# Patient Record
Sex: Female | Born: 1990 | Race: Black or African American | Hispanic: No | Marital: Single | Smoking: Never smoker
Health system: Southern US, Community
[De-identification: ages and names within clinical notes are randomized; demographics above are authoritative.]

## PROBLEM LIST (undated history)

## (undated) ENCOUNTER — Inpatient Hospital Stay (HOSPITAL_COMMUNITY): Payer: Self-pay

## (undated) DIAGNOSIS — O4590 Premature separation of placenta, unspecified, unspecified trimester: Secondary | ICD-10-CM

## (undated) DIAGNOSIS — R569 Unspecified convulsions: Secondary | ICD-10-CM

## (undated) DIAGNOSIS — Z8759 Personal history of other complications of pregnancy, childbirth and the puerperium: Secondary | ICD-10-CM

## (undated) DIAGNOSIS — R87629 Unspecified abnormal cytological findings in specimens from vagina: Secondary | ICD-10-CM

## (undated) HISTORY — DX: Personal history of other complications of pregnancy, childbirth and the puerperium: Z87.59

## (undated) HISTORY — DX: Unspecified convulsions: R56.9

## (undated) HISTORY — PX: NO PAST SURGERIES: SHX2092

## (undated) HISTORY — DX: Unspecified abnormal cytological findings in specimens from vagina: R87.629

---

## 2014-09-14 ENCOUNTER — Encounter: Payer: Self-pay | Admitting: Obstetrics & Gynecology

## 2014-09-14 ENCOUNTER — Other Ambulatory Visit (HOSPITAL_COMMUNITY)
Admission: RE | Admit: 2014-09-14 | Discharge: 2014-09-14 | Disposition: A | Discharge status: Home / Self Care | Payer: BLUE CROSS/BLUE SHIELD | Source: Ambulatory Visit | Attending: Obstetrics & Gynecology | Admitting: Obstetrics & Gynecology

## 2014-09-14 ENCOUNTER — Ambulatory Visit (INDEPENDENT_AMBULATORY_CARE_PROVIDER_SITE_OTHER): Payer: BLUE CROSS/BLUE SHIELD | Admitting: Obstetrics & Gynecology

## 2014-09-14 VITALS — BP 115/73 | HR 84 | Resp 20 | Ht 63.0 in | Wt 154.1 lb

## 2014-09-14 DIAGNOSIS — R87612 Low grade squamous intraepithelial lesion on cytologic smear of cervix (LGSIL): Secondary | ICD-10-CM | POA: Diagnosis not present

## 2014-09-14 DIAGNOSIS — R87619 Unspecified abnormal cytological findings in specimens from cervix uteri: Secondary | ICD-10-CM | POA: Diagnosis present

## 2014-09-14 DIAGNOSIS — Z3202 Encounter for pregnancy test, result negative: Secondary | ICD-10-CM | POA: Diagnosis not present

## 2014-09-14 LAB — POCT PREGNANCY, URINE: Preg Test, Ur: NEGATIVE

## 2014-09-14 NOTE — Progress Notes (Signed)
Patient ID: Marissa Cross, female   DOB: Feb 26, 1990, 24 y.o.   MRN: [id number]  Chief Complaint  Patient presents with  . Referral    [facility] - abnormal Pap    HPI Marissa Cross is a 24 y.o. female.  G1P1001 Patient's last menstrual period was 06/15/2014 (approximate). LSIL pap at [facility]  HPI  Indications: Pap smear on June 2016 showed: low-grade squamous intraepithelial neoplasia (LGSIL - encompassing HPV,mild dysplasia,CIN I). Previous colposcopy: no. Prior cervical treatment:  . Previous ASCUS Past Medical History  Diagnosis Date  . Vaginal Pap smear, abnormal     History reviewed. No pertinent past surgical history.  History reviewed. No pertinent family history.  Social History Social History  Substance Use Topics  . Smoking status: Never Smoker   . Smokeless tobacco: None  . Alcohol Use: Yes     Comment: holidays only    No Known Allergies  Current Outpatient Prescriptions  Medication Sig Dispense Refill  . Levonorgestrel-Ethinyl Estradiol (ASHLYNA) 0.15-0.03 &0.01 MG tablet Take 1 tablet by mouth daily.     No current facility-administered medications for this visit.    Review of Systems Review of Systems  Constitutional: Negative.   Genitourinary: Negative for vaginal bleeding, menstrual problem and pelvic pain.    Blood pressure 115/73, pulse 84, resp. rate 20, height  (1.6 m), weight 154 lb 1.6 oz (69.899 kg), last menstrual period 06/15/2014.  Physical Exam Physical Exam  Constitutional: She appears well-developed. No distress.  Pulmonary/Chest: Effort normal.  Genitourinary: Vagina normal. No vaginal discharge found.  Psychiatric: She has a normal mood and affect.    Data Reviewed Pap result  Assessment    Procedure Details  The risks and benefits of the procedure and Written informed consent obtained.  Speculum placed in vagina and excellent visualization of cervix achieved, cervix swabbed x 3 with acetic acid solution. AWE ant and post  cervix c/w LSIL, no endocervical lesion Specimens: ECC and 12 and 500  Complications: none. Monsel's soln applied and good hemostasis    Plan    Specimens labelled and sent to Pathology. Return to discuss Pathology results in 2 weeks.      Marissa Cross 09/14/2014, 3:00 PM

## 2014-09-14 NOTE — Patient Instructions (Signed)

## 2014-09-16 ENCOUNTER — Encounter: Payer: Self-pay | Admitting: Obstetrics & Gynecology

## 2014-09-16 DIAGNOSIS — N870 Mild cervical dysplasia: Secondary | ICD-10-CM | POA: Insufficient documentation

## 2014-09-16 HISTORY — DX: Mild cervical dysplasia: N87.0

## 2014-09-20 ENCOUNTER — Telehealth: Payer: Self-pay | Admitting: *Deleted

## 2014-09-20 NOTE — Telephone Encounter (Signed)
Pt needs to be informed she  will need cotesting in 12 months, result note on surgical pathology report.  Attempted to contact patient, no answer, left message for patient to return call to the clinic.

## 2014-09-21 ENCOUNTER — Encounter: Payer: Self-pay | Admitting: *Deleted

## 2014-09-21 NOTE — Telephone Encounter (Signed)
Attempted to contact patient, no answer, will send letter. Certified letter sent.

## 2014-09-30 ENCOUNTER — Ambulatory Visit: Payer: BLUE CROSS/BLUE SHIELD | Admitting: Family Medicine

## 2014-11-21 ENCOUNTER — Ambulatory Visit (INDEPENDENT_AMBULATORY_CARE_PROVIDER_SITE_OTHER): Payer: BLUE CROSS/BLUE SHIELD | Admitting: Obstetrics & Gynecology

## 2014-11-21 ENCOUNTER — Encounter: Payer: Self-pay | Admitting: Obstetrics & Gynecology

## 2014-11-21 VITALS — BP 101/68 | HR 68 | Temp 98.7°F | Wt 157.4 lb

## 2014-11-21 DIAGNOSIS — N9489 Other specified conditions associated with female genital organs and menstrual cycle: Secondary | ICD-10-CM | POA: Diagnosis not present

## 2014-11-21 DIAGNOSIS — R87612 Low grade squamous intraepithelial lesion on cytologic smear of cervix (LGSIL): Secondary | ICD-10-CM | POA: Diagnosis not present

## 2014-11-21 DIAGNOSIS — N898 Other specified noninflammatory disorders of vagina: Secondary | ICD-10-CM

## 2014-11-21 DIAGNOSIS — N870 Mild cervical dysplasia: Secondary | ICD-10-CM | POA: Insufficient documentation

## 2014-11-21 NOTE — Progress Notes (Signed)
   Subjective:    Patient ID: Marissa Cross, female    DOB: Jun 01, 1990, 24 y.o.   MRN: [id number]  HPI  24 yo AA 5P1 (854 yo son) is here today with the complaint of a unpleasant vaginal odor since her colpo and also to discuss her colpo results.   Review of Systems She gets regular STI testing at the health dept.    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

## 2014-11-22 LAB — WET PREP, GENITAL: TRICH WET PREP: NONE SEEN

## 2014-11-25 ENCOUNTER — Emergency Department (HOSPITAL_COMMUNITY)
Admission: EM | Admit: 2014-11-25 | Discharge: 2014-11-25 | Disposition: A | Discharge status: Home / Self Care | Payer: BLUE CROSS/BLUE SHIELD | Attending: Emergency Medicine | Admitting: Emergency Medicine

## 2014-11-25 ENCOUNTER — Encounter (HOSPITAL_COMMUNITY): Payer: Self-pay

## 2014-11-25 DIAGNOSIS — Y998 Other external cause status: Secondary | ICD-10-CM | POA: Diagnosis not present

## 2014-11-25 DIAGNOSIS — S3992XA Unspecified injury of lower back, initial encounter: Secondary | ICD-10-CM | POA: Diagnosis present

## 2014-11-25 DIAGNOSIS — Y9289 Other specified places as the place of occurrence of the external cause: Secondary | ICD-10-CM | POA: Diagnosis not present

## 2014-11-25 DIAGNOSIS — Y9389 Activity, other specified: Secondary | ICD-10-CM | POA: Diagnosis not present

## 2014-11-25 MED ORDER — METHOCARBAMOL 500 MG PO TABS: 500.0000 mg | ORAL_TABLET | Freq: Two times a day (BID) | ORAL | Status: DC

## 2014-11-25 MED ORDER — IBUPROFEN 800 MG PO TABS: 800.0000 mg | ORAL_TABLET | Freq: Three times a day (TID) | ORAL | Status: DC
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
## 2014-11-25 NOTE — Discharge Instructions (Signed)
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
## 2014-11-28 ENCOUNTER — Telehealth (HOSPITAL_COMMUNITY): Payer: Self-pay

## 2014-11-28 ENCOUNTER — Telehealth (HOSPITAL_BASED_OUTPATIENT_CLINIC_OR_DEPARTMENT_OTHER): Payer: Self-pay

## 2015-03-02 ENCOUNTER — Inpatient Hospital Stay (HOSPITAL_COMMUNITY)
Admission: AD | Admit: 2015-03-02 | Discharge: 2015-03-02 | Disposition: A | Discharge status: Home / Self Care | Payer: Managed Care, Other (non HMO) | Source: Ambulatory Visit | Attending: Obstetrics & Gynecology | Admitting: Obstetrics & Gynecology

## 2015-03-02 ENCOUNTER — Encounter (HOSPITAL_COMMUNITY): Payer: Self-pay | Admitting: *Deleted

## 2015-03-02 DIAGNOSIS — Z3202 Encounter for pregnancy test, result negative: Secondary | ICD-10-CM | POA: Insufficient documentation

## 2015-03-02 DIAGNOSIS — N898 Other specified noninflammatory disorders of vagina: Secondary | ICD-10-CM | POA: Diagnosis not present

## 2015-03-02 DIAGNOSIS — R109 Unspecified abdominal pain: Secondary | ICD-10-CM | POA: Insufficient documentation

## 2015-03-02 DIAGNOSIS — N926 Irregular menstruation, unspecified: Secondary | ICD-10-CM | POA: Diagnosis not present

## 2015-03-02 DIAGNOSIS — N921 Excessive and frequent menstruation with irregular cycle: Secondary | ICD-10-CM | POA: Diagnosis not present

## 2015-03-02 LAB — URINE MICROSCOPIC-ADD ON

## 2015-03-02 LAB — CBC
HEMATOCRIT: 38.8 % (ref 36.0–46.0)
HEMOGLOBIN: 14.1 g/dL (ref 12.0–15.0)
MCH: 31.1 pg (ref 26.0–34.0)
MCHC: 36.3 g/dL — ABNORMAL HIGH (ref 30.0–36.0)
MCV: 85.5 fL (ref 78.0–100.0)
Platelets: 295 10*3/uL (ref 150–400)
RBC: 4.54 MIL/uL (ref 3.87–5.11)
RDW: 12.5 % (ref 11.5–15.5)
WBC: 4.2 10*3/uL (ref 4.0–10.5)

## 2015-03-02 LAB — URINALYSIS, ROUTINE W REFLEX MICROSCOPIC
Bilirubin Urine: NEGATIVE
Glucose, UA: NEGATIVE mg/dL
Ketones, ur: NEGATIVE mg/dL
LEUKOCYTES UA: NEGATIVE
Nitrite: NEGATIVE
Protein, ur: NEGATIVE mg/dL
Specific Gravity, Urine: 1.025 (ref 1.005–1.030)
pH: 5.5 (ref 5.0–8.0)

## 2015-03-02 LAB — WET PREP, GENITAL
Clue Cells Wet Prep HPF POC: NONE SEEN
Sperm: NONE SEEN
Trich, Wet Prep: NONE SEEN
Yeast Wet Prep HPF POC: NONE SEEN

## 2015-03-02 LAB — POCT PREGNANCY, URINE: Preg Test, Ur: NEGATIVE

## 2015-03-02 MED ORDER — IBUPROFEN 800 MG PO TABS: 800.0000 mg | ORAL_TABLET | Freq: Once | ORAL | Status: DC

## 2015-03-02 NOTE — MAU Provider Note (Signed)
History     CSN: [id number]  Arrival date and time: 03/02/15 4098   First Provider Initiated Contact with Patient 03/02/15 2010      Chief Complaint  Patient presents with  . Abdominal Pain  . Vaginal Bleeding   Vaginal Bleeding The patient's primary symptoms include vaginal bleeding. This is a new problem. The current episode started today. The problem occurs constantly. The problem has been unchanged. The pain is mild. The problem affects both sides. She is not pregnant. The vaginal discharge was bloody. The vaginal bleeding is typical of menses. She has not been passing clots. She has not been passing tissue. Nothing aggravates the symptoms. She has tried nothing for the symptoms. She is sexually active. She uses oral contraceptives (seasonique. Took one pill on 2/11 and then stopped was planning to start on 2/19 ) for contraception. Her menstrual history has been irregular (on seasonique. LMP 02/19/15 ).     Past Medical History  Diagnosis Date  . Vaginal Pap smear, abnormal     History reviewed. No pertinent past surgical history.  History reviewed. No pertinent family history.  Social History  Substance Use Topics  . Smoking status: Never Smoker   . Smokeless tobacco: None  . Alcohol Use: Yes     Comment: holidays only    Allergies: No Known Allergies  Prescriptions prior to admission  Medication Sig Dispense Refill Last Dose  . ibuprofen (ADVIL,MOTRIN) 800 MG tablet Take 1 tablet (800 mg total) by mouth 3 (three) times daily. 21 tablet 0   . Levonorgestrel-Ethinyl Estradiol (ASHLYNA) 0.15-0.03 &0.01 MG tablet Take 1 tablet by mouth daily.   Taking  . methocarbamol (ROBAXIN) 500 MG tablet Take 1 tablet (500 mg total) by mouth 2 (two) times daily. 20 tablet 0     Review of Systems  Genitourinary: Positive for vaginal bleeding.   Physical Exam   Blood pressure 107/73, pulse 84, temperature 98.1 F (36.7 C), temperature source Oral, resp. rate 18, height   (1.575 m), weight 75.467 kg (166 lb 6 oz), last menstrual period 02/19/2015.  Physical Exam  Nursing note and vitals reviewed. Constitutional: She is oriented to person, place, and time. She appears well-developed and well-nourished. No distress.  HENT:  Head: Normocephalic.  Cardiovascular: Normal rate.   Respiratory: Effort normal.  GI: Soft. There is no tenderness. There is no rebound.  Genitourinary:   External: no lesion Vagina: small amount of blood seen  Cervix: pink, smooth, no CMT Uterus: NSSC Adnexa: NT   Musculoskeletal: Normal range of motion.  Neurological: She is alert and oriented to person, place, and time.  Skin: Skin is warm and dry.  Psychiatric: She has a normal mood and affect.   Results for orders placed or performed during the hospital encounter of 03/02/15 (from the past 24 hour(s))  Urinalysis, Routine w reflex microscopic (not at [facility])     Status: Abnormal   Collection Time: 03/02/15  7:39 PM  Result Value Ref Range   Color, Urine YELLOW YELLOW   APPearance CLEAR CLEAR   Specific Gravity, Urine 1.025 1.005 - 1.030   pH 5.5 5.0 - 8.0   Glucose, UA NEGATIVE NEGATIVE mg/dL   Hgb urine dipstick LARGE (A) NEGATIVE   Bilirubin Urine NEGATIVE NEGATIVE   Ketones, ur NEGATIVE NEGATIVE mg/dL   Protein, ur NEGATIVE NEGATIVE mg/dL   Nitrite NEGATIVE NEGATIVE   Leukocytes, UA NEGATIVE NEGATIVE  Urine microscopic-add on     Status: Abnormal   Collection Time: 03/02/15  7:39 PM  Result Value Ref Range   Squamous Epithelial / LPF 0-5 (A) NONE SEEN   WBC, UA 0-5 0 - 5 WBC/hpf   RBC / HPF 0-5 0 - 5 RBC/hpf   Bacteria, UA RARE (A) NONE SEEN   Urine-Other MUCOUS PRESENT   Pregnancy, urine POC     Status: None   Collection Time: 03/02/15  7:51 PM  Result Value Ref Range   Preg Test, Ur NEGATIVE NEGATIVE  CBC     Status: Abnormal   Collection Time: 03/02/15  8:20 PM  Result Value Ref Range   WBC 4.2 4.0 - 10.5 K/uL   RBC 4.54 3.87 - 5.11 MIL/uL   Hemoglobin  14.1 12.0 - 15.0 g/dL   HCT 16.1 09.6 - 04.5 %   MCV 85.5 78.0 - 100.0 fL   MCH 31.1 26.0 - 34.0 pg   MCHC 36.3 (H) 30.0 - 36.0 g/dL   RDW 40.9 81.1 - 91.4 %   Platelets 295 150 - 400 K/uL  Wet prep, genital     Status: Abnormal   Collection Time: 03/02/15  8:45 PM  Result Value Ref Range   Yeast Wet Prep HPF POC NONE SEEN NONE SEEN   Trich, Wet Prep NONE SEEN NONE SEEN   Clue Cells Wet Prep HPF POC NONE SEEN NONE SEEN   WBC, Wet Prep HPF POC FEW (A) NONE SEEN   Sperm NONE SEEN      MAU Course  Procedures  MDM   Assessment and Plan   1. Breakthrough bleeding on birth control pills    DC home Comfort measures reviewed  Bleeding precautions RX: none    Follow-up Information    Follow up with [facility].   Why:  If symptoms worsen   Contact information:   [address] [place] [address] [phone]         Tawnya Crook 03/02/2015, 8:12 PM

## 2015-03-02 NOTE — MAU Note (Addendum)
PT SAYS  FEELS LOWER ABD  PAIN   AND  CRAMPS  IN RECTUM  AND  VAG BLEEDING  STARTED  TODAY-     LMP 2-5.      SAYS VAGINA BURNS .- NO RASH.      WAS ON BCP-  -WAS SUPPOSE  TO START NEW PACK AFTER CYCLE-  BUT DID NOT.        LAST SEX-  LAST  NIGHT.    GOES  TO [facility]  FOR  PAP SMEARS- SEEN LAST  -2016

## 2015-03-02 NOTE — Discharge Instructions (Signed)

## 2015-03-03 LAB — GC/CHLAMYDIA PROBE AMP (~~LOC~~) NOT AT ARMC
CHLAMYDIA, DNA PROBE: NEGATIVE
Neisseria Gonorrhea: NEGATIVE

## 2015-03-03 LAB — RPR: RPR: NONREACTIVE

## 2015-03-03 LAB — HIV ANTIBODY (ROUTINE TESTING W REFLEX): HIV Screen 4th Generation wRfx: NONREACTIVE

## 2015-10-17 ENCOUNTER — Encounter: Payer: Self-pay | Admitting: Medical

## 2015-10-17 ENCOUNTER — Ambulatory Visit (INDEPENDENT_AMBULATORY_CARE_PROVIDER_SITE_OTHER): Payer: Managed Care, Other (non HMO) | Admitting: Medical

## 2015-10-17 VITALS — BP 122/84 | HR 88 | Temp 98.4°F | Ht 63.0 in | Wt 168.1 lb

## 2015-10-17 DIAGNOSIS — M26629 Arthralgia of temporomandibular joint, unspecified side: Secondary | ICD-10-CM

## 2015-10-17 NOTE — Progress Notes (Signed)
Subjective:   Marissa Cross is a 25 y.o. female who presents with ear pain and possible ear infection.   Was seeing health dept prior.   Chief Complaint  Patient presents with  . Ear Pain    right ear, started about 3 days ago    Here for right ear pain, started 3 days ago.  Has headache, constant the past 3 days.    Does have some sore throat.  Teeth sensitive.   No sinus pain.   Using nothing for symptoms.   No sick contacts.  Works at call center.  Chews gum often.  She has 26yo in kindergarten and 26 yo in day care.  She is a nonsmoker.    doesn't have hx/o frequent ear infection.   Last illness was a while ago.  No other aggravating or relieving factors. No other complaint.   Past Medical History:  Diagnosis Date  . Vaginal Pap smear, abnormal    ROS as in subjective   Objective: BP 122/84   Pulse 88   Temp 98.4 F (36.9 C) (Oral)   Ht 5\' 3"  (1.6 m)   Wt 168 lb 2 oz (76.3 kg)   SpO2 95%   BMI 29.78 kg/m    General appearance: Alert, WD/WN, no distress                             Skin: warm, no rash                           Head: no sinus tenderness, but quite tender over bilat TMJ, worse R>L                            Eyes: conjunctiva normal, corneas clear, PERRLA                            Ears: TMs and ear canals and ear pinna normal                          Nose: septum midline, turbinates normal, no erythema, no discharge             Mouth/throat: MMM, tongue normal, no pharyngeal erythema                           Neck: supple, no adenopathy, no thyromegaly, nontender                         Assessment  Encounter Diagnosis  Name Primary?  . TMJ syndrome Yes      Plan: Discussed symptoms, findings, suggestive of TMJ syndrome and not ear infection, no signs of allergies, teeth decay or other.  Recommendations:  Talk as little as possible this week  Avoid gum or heavy chewing for the next 5-7 days, particularly things like meat  avoid hard candy  Drink  plenty of water  Begin OTC Motrin 200mg , take 3 tablets twice daily for the next 5- 7 days  Use Robaxin muscle relaxer you have at home at bedtime the next few nights as this may help relax the jaw muscles  Use can use cool pack to TMJ area  If not much improved within 1-2 weeks, then recheck  Patient was advised to call or return if worse or not improving in the next few days.    Patient voiced understanding of diagnosis, recommendations, and treatment plan.  After visit summary given.

## 2015-10-17 NOTE — Patient Instructions (Signed)
Encounter Diagnosis  Name Primary?  . TMJ syndrome Yes   Recommendations:  Talk as little as possible this week  Avoid gum or heavy chewing for the next 5-7 days, particularly things like meat  avoid hard candy  Drink plenty of water  Begin OTC Motrin 200mg , take 3 tablets twice daily for the next 5- 7 days  Use Robaxin muscle relaxer you have at home at bedtime the next few nights as this may help relax the jaw muscles  Use can use cool pack to TMJ area  If not much improved within 1-2 weeks, then recheck   Temporomandibular Joint Syndrome Temporomandibular joint (TMJ) syndrome is a condition that affects the joints between your jaw and your skull. The TMJs are located near your ears and allow your jaw to open and close. These joints and the nearby muscles are involved in all movements of the jaw. People with TMJ syndrome have pain in the area of these joints and muscles. Chewing, biting, or other movements of the jaw can be difficult or painful. TMJ syndrome can be caused by various things. In many cases, the condition is mild and goes away within a few weeks. For some people, the condition can become a long-term problem. CAUSES Possible causes of TMJ syndrome include:  Grinding your teeth or clenching your jaw. Some people do this when they are under stress.  Arthritis.  Injury to the jaw.  Head or neck injury.  Teeth or dentures that are not aligned well. In some cases, the cause of TMJ syndrome may not be known. SIGNS AND SYMPTOMS The most common symptom is an aching pain on the side of the head in the area of the TMJ. Other symptoms may include:  Pain when moving your jaw, such as when chewing or biting.  Being unable to open your jaw all the way.  Making a clicking sound when you open your mouth.  Headache.  Earache.  Neck or shoulder pain. DIAGNOSIS Diagnosis can usually be made based on your symptoms, your medical history, and a physical exam. Your  health care provider may check the range of motion of your jaw. Imaging tests, such as X-rays or an MRI, are sometimes done. You may need to see your dentist to determine if your teeth and jaw are lined up correctly. TREATMENT TMJ syndrome often goes away on its own. If treatment is needed, the options may include:  Eating soft foods and applying ice or heat.  Medicines to relieve pain or inflammation.  Medicines to relax the muscles.  A splint, bite plate, or mouthpiece to prevent teeth grinding or jaw clenching.  Relaxation techniques or counseling to help reduce stress.  Transcutaneous electrical nerve stimulation (TENS). This helps to relieve pain by applying an electrical current through the skin.  Acupuncture. This is sometimes helpful to relieve pain.  Jaw surgery. This is rarely needed. HOME CARE INSTRUCTIONS  Take medicines only as directed by your health care provider.  Eat a soft diet if you are having trouble chewing.  Apply ice to the painful area.  Put ice in a plastic bag.  Place a towel between your skin and the bag.  Leave the ice on for 20 minutes, 2-3 times a day.  Apply a warm compress to the painful area as directed.  Massage your jaw area and perform any jaw stretching exercises as recommended by your health care provider.  If you were given a mouthpiece or bite plate, wear it as directed.  Avoid foods that require a lot of chewing. Do not chew gum.  Keep all follow-up visits as directed by your health care provider. This is important. SEEK MEDICAL CARE IF:  You are having trouble eating.  You have new or worsening symptoms. SEEK IMMEDIATE MEDICAL CARE IF:  Your jaw locks open or closed.   This information is not intended to replace advice given to you by your health care provider. Make sure you discuss any questions you have with your health care provider.   Document Released: 09/25/2000 Document Revised: 01/21/2014 Document Reviewed:  08/05/2013 Elsevier Interactive Patient Education [employer]2016 [employer]

## 2016-02-21 ENCOUNTER — Encounter: Payer: Self-pay | Admitting: Family Medicine

## 2016-02-21 ENCOUNTER — Ambulatory Visit (INDEPENDENT_AMBULATORY_CARE_PROVIDER_SITE_OTHER): Payer: Managed Care, Other (non HMO) | Admitting: Family Medicine

## 2016-02-21 VITALS — BP 120/70 | HR 94 | Temp 98.7°F | Wt 152.4 lb

## 2016-02-21 DIAGNOSIS — Z3201 Encounter for pregnancy test, result positive: Secondary | ICD-10-CM

## 2016-02-21 DIAGNOSIS — A084 Viral intestinal infection, unspecified: Secondary | ICD-10-CM | POA: Diagnosis not present

## 2016-02-21 DIAGNOSIS — R197 Diarrhea, unspecified: Secondary | ICD-10-CM | POA: Diagnosis not present

## 2016-02-21 DIAGNOSIS — R112 Nausea with vomiting, unspecified: Secondary | ICD-10-CM | POA: Diagnosis not present

## 2016-02-21 DIAGNOSIS — R109 Unspecified abdominal pain: Secondary | ICD-10-CM

## 2016-02-21 LAB — POCT URINALYSIS DIPSTICK
Bilirubin, UA: NEGATIVE
Glucose, UA: NEGATIVE
Leukocytes, UA: NEGATIVE
Nitrite, UA: NEGATIVE
PH UA: 5.5
RBC UA: NEGATIVE
Urobilinogen, UA: NEGATIVE

## 2016-02-21 LAB — COMPREHENSIVE METABOLIC PANEL
ALT: 12 U/L (ref 6–29)
AST: 14 U/L (ref 10–30)
Albumin: 4.3 g/dL (ref 3.6–5.1)
Alkaline Phosphatase: 69 U/L (ref 33–115)
BUN: 7 mg/dL (ref 7–25)
CALCIUM: 9.4 mg/dL (ref 8.6–10.2)
CO2: 22 mmol/L (ref 20–31)
Chloride: 103 mmol/L (ref 98–110)
Creat: 0.65 mg/dL (ref 0.50–1.10)
GLUCOSE: 92 mg/dL (ref 65–99)
Potassium: 3.9 mmol/L (ref 3.5–5.3)
Sodium: 135 mmol/L (ref 135–146)
Total Bilirubin: 0.6 mg/dL (ref 0.2–1.2)
Total Protein: 6.9 g/dL (ref 6.1–8.1)

## 2016-02-21 LAB — CBC WITH DIFFERENTIAL/PLATELET
Basophils Absolute: 0 cells/uL (ref 0–200)
Basophils Relative: 0 %
EOS PCT: 0 %
Eosinophils Absolute: 0 cells/uL — ABNORMAL LOW (ref 15–500)
HEMATOCRIT: 41.7 % (ref 35.0–45.0)
Hemoglobin: 14.5 g/dL (ref 11.7–15.5)
LYMPHS PCT: 6 %
Lymphs Abs: 330 cells/uL — ABNORMAL LOW (ref 850–3900)
MCH: 30.5 pg (ref 27.0–33.0)
MCHC: 34.8 g/dL (ref 32.0–36.0)
MCV: 87.8 fL (ref 80.0–100.0)
MPV: 8.9 fL (ref 7.5–12.5)
Monocytes Absolute: 275 cells/uL (ref 200–950)
Monocytes Relative: 5 %
NEUTROS PCT: 89 %
Neutro Abs: 4895 cells/uL (ref 1500–7800)
Platelets: 251 10*3/uL (ref 140–400)
RBC: 4.75 MIL/uL (ref 3.80–5.10)
RDW: 13.9 % (ref 11.0–15.0)
WBC: 5.5 10*3/uL (ref 4.0–10.5)

## 2016-02-21 LAB — POCT URINE PREGNANCY: Preg Test, Ur: POSITIVE — AB

## 2016-02-21 MED ORDER — ONDANSETRON 4 MG PO TBDP: 4.0000 mg | ORAL_TABLET | Freq: Three times a day (TID) | ORAL | 0 refills | Status: DC | PRN

## 2016-02-21 NOTE — Patient Instructions (Addendum)
Call [facility] for an appointment [phone] Start taking a pre-natal vitamin with folic acid. Increase fluids, your urine shows that you are dehydrated.  Stop taking ibuprofen, motrin, advil, aleve or aspirin. You can take Tylenol if needed.   Do not take the Zofran. For nausea you can take Unisom 1/2 to 1 tablet at bedtime. Ginger is also good.  For diarrhea you can safely take  Kaopectate or Immodium A-D  Commonly Asked Questions During Pregnancy  Cats: A parasite can be excreted in cat feces.  To avoid exposure you need to have another person empty the little box.  If you must empty the litter box you will need to wear gloves.  Wash your hands after handling your cat.  This parasite can also be found in raw or undercooked meat so this should also be avoided.  Colds, Sore Throats, Flu: Please check your medication sheet to see what you can take for symptoms.  If your symptoms are unrelieved by these medications please call the office.  Dental Work: Most any dental work Agricultural consultant recommends is permitted.  X-rays should only be taken during the first trimester if absolutely necessary.  Your abdomen should be shielded with a lead apron during all x-rays.  Please notify your provider prior to receiving any x-rays.  Novocaine is fine; gas is not recommended.  If your dentist requires a note from [place] prior to dental work please call the office and we will provide one for you.  Exercise: Exercise is an important part of staying healthy during your pregnancy.  You may continue most exercises you were accustomed to prior to pregnancy.  Later in your pregnancy you will most likely notice you have difficulty with activities requiring balance like riding a bicycle.  It is important that you listen to your body and avoid activities that put you at a higher risk of falling.  Adequate rest and staying well hydrated are a must!  If you have questions about the safety of specific activities ask your  provider.    Exposure to Children with illness: Try to avoid obvious exposure; report any symptoms to [place] when noted,  If you have chicken pos, red measles or mumps, you should be immune to these diseases.   Please do not take any vaccines while pregnant unless you have checked with your OB provider.  Fetal Movement: After 28 weeks we recommend you do "kick counts" twice daily.  Lie or sit down in a calm quiet environment and count your baby movements "kicks".  You should feel your baby at least 10 times per hour.  If you have not felt 10 kicks within the first hour get up, walk around and have something sweet to eat or drink then repeat for an additional hour.  If count remains less than 10 per hour notify your provider.  Fumigating: Follow your pest control agent's advice as to how long to stay out of your home.  Ventilate the area well before re-entering.  Hemorrhoids:   Most over-the-counter preparations can be used during pregnancy.  Check your medication to see what is safe to use.  It is important to use a stool softener or fiber in your diet and to drink lots of liquids.  If hemorrhoids seem to be getting worse please call the office.   Hot Tubs:  Hot tubs Jacuzzis and saunas are not recommended while pregnant.  These increase your internal body temperature and should be avoided.  Intercourse:  Sexual intercourse  is safe during pregnancy as long as you are comfortable, unless otherwise advised by your provider.  Spotting may occur after intercourse; report any bright red bleeding that is heavier than spotting.  Labor:  If you know that you are in labor, please go to the hospital.  If you are unsure, please call the office and let us help you decide what to do.  Lifting, straining, etc:  If your job requires heavy lifting or straining please check with your provider for any limitations.  Generally, you should not lift items heavier than that you can lift simply with your hands and arms (no back  muscles)  Painting:  Paint fumes do not harm your pregnancy, but may make you ill and should be avoided if possible.  Latex or water based paints have less odor than oils.  Use adequate ventilation while painting.  Permanents & Hair Color:  Chemicals in hair dyes are not recommended as they cause increase hair dryness which can increase hair loss during pregnancy.  " Highlighting" and permanents are allowed.  Dye may be absorbed differently and permanents may not hold as well during pregnancy.  Sunbathing:  Use a sunscreen, as skin burns easily during pregnancy.  Drink plenty of fluids; avoid over heating.  Tanning Beds:  Because their possible side effects are still unknown, tanning beds are not recommended.  Ultrasound Scans:  Routine ultrasounds are performed at approximately 20 weeks.  You will be able to see your baby's general anatomy an if you would like to know the gender this can usually be determined as well.  If it is questionable when you conceived you may also receive an ultrasound early in your pregnancy for dating purposes.  Otherwise ultrasound exams are not routinely performed unless there is a medical necessity.  Although you can request a scan we ask that you pay for it when conducted because insurance does not cover " patient request" scans.  Work: If your pregnancy proceeds without complications you may work until your due date, unless your physician or employer advises otherwise.  Round Ligament Pain/Pelvic Discomfort:  Sharp, shooting pains not associated with bleeding are fairly common, usually occurring in the second trimester of pregnancy.  They tend to be worse when standing up or when you remain standing for long periods of time.  These are the result of pressure of certain pelvic ligaments called "round ligaments".  Rest, Tylenol and heat seem to be the most effective relief.  As the womb and fetus grow, they rise out of the pelvis and the discomfort improves.  Please  notify the office if your pain seems different than that described.  It may represent a more serious condition.

## 2016-02-21 NOTE — Progress Notes (Signed)
Subjective:    Patient ID: Marissa Cross, female    DOB: 1991/01/10, 26 y.o.   MRN: [id number]  HPI Chief Complaint  Patient presents with  . sick    throwing up, diarrhea, headache, achy- feeling bad since last week but came on sunday full force   She is here with complaints of body aches, fatigue, headache, back pain, vomiting and diarrhea for the past 2 days. States she has had 2-3 episodes of vomiting and brown loose stools today.  States she does not feel like eating but has been able to keep sips of fluids down.  Complains of abdominal cramping.  States she has not urinated since this morning.    Positive sick contacts:  27 year old niece had similar symptoms over the weekend.   Denies fever, chills, dizziness, chest pain, palpitations, rhinorrhea, nasal congestion, sore throat, cough, shortness of breath.   LMP: states they are irregular and there is a chance of pregnancy. States she was taking birth control pills but not on a daily basis.    Does not smoke, drink alcohol or use drugs.   Reviewed allergies, medications, past medical, and social history.    Review of Systems Pertinent positives and negatives in the history of present illness.     Objective:   Physical Exam  Constitutional: She is oriented to person, place, and time. She appears well-developed and well-nourished. No distress.  HENT:  Mouth/Throat: Uvula is midline, oropharynx is clear and moist and mucous membranes are normal.  Eyes: Conjunctivae and EOM are normal. Pupils are equal, round, and reactive to light.  Neck: Normal range of motion. Neck supple.  Cardiovascular: Normal rate, regular rhythm, normal heart sounds and intact distal pulses.   Pulmonary/Chest: Effort normal and breath sounds normal.  Abdominal: Soft. Normal appearance. She exhibits no distension. Bowel sounds are increased. There is no hepatosplenomegaly. There is no tenderness. There is no rigidity, no rebound, no guarding, no CVA  tenderness, no tenderness at McBurney's point and negative Murphy's sign.  Lymphadenopathy:    She has no cervical adenopathy.  Neurological: She is alert and oriented to person, place, and time. She has normal strength. Gait normal.  Skin: Skin is warm and dry. No rash noted. She is not diaphoretic. No pallor.   BP 120/70   Pulse 94   Temp 98.7 F (37.1 C) (Oral)   Wt 152 lb 6.4 oz (69.1 kg)   LMP  (LMP Unknown)   SpO2 98%   BMI 27.00 kg/m       Assessment & Plan:  Nausea vomiting and diarrhea - Plan: CBC with Differential/Platelet, Comprehensive metabolic panel  Viral gastroenteritis  Abdominal cramping - Plan: POCT urine pregnancy, POCT urinalysis dipstick  Positive pregnancy test - Plan: CBC with Differential/Platelet, Comprehensive metabolic panel, hCG, quantitative, pregnancy, Ambulatory referral to Obstetrics / Gynecology  UA 2+ketone, spec grav 1.030 Urine pregnancy is positive. States she was not trying to get pregnant. States she has a 27 year old. She is in a stable relationship. Periods have been irregular. Not able to calculate date of conception. Will check hCG quant.  Discussed safety in pregnancy. Educated on avoiding harmful medications, alcohol.  Advised to start taking prenatal vitamins with folic acid.  Referral made and she will schedule with an OB/GYN.  She appears to have a viral GI issue and plan to treat with supportive therapy. Advised that staying hydrated it key. If she cannot keep fluids down she is aware that she may need  to go to the ED for IV fluids.  Prescription for zofran was sent to pharmacy prior to positive pregnancy test. This was cancelled and patient advised to not take it as it is not safe in pregnancy.  She will follow up as needed.

## 2016-02-22 LAB — HCG, QUANTITATIVE, PREGNANCY: hCG, Beta Chain, Quant, S: 41040.6 m[IU]/mL — ABNORMAL HIGH

## 2016-02-26 ENCOUNTER — Ambulatory Visit: Payer: Managed Care, Other (non HMO) | Admitting: Family Medicine

## 2016-02-26 ENCOUNTER — Telehealth: Payer: Self-pay | Admitting: Family Medicine

## 2016-02-26 NOTE — Telephone Encounter (Signed)
Marissa Cross from [facility] called & requested labs, these were faxed  Pt will be seeing Dr. Cherly Hensenousins t# [phone] fax# [phone]

## 2016-03-15 LAB — OB RESULTS CONSOLE ANTIBODY SCREEN: Antibody Screen: NEGATIVE

## 2016-03-15 LAB — OB RESULTS CONSOLE RPR: RPR: NONREACTIVE

## 2016-03-15 LAB — OB RESULTS CONSOLE GC/CHLAMYDIA
Chlamydia: NEGATIVE
Gonorrhea: NEGATIVE

## 2016-03-15 LAB — OB RESULTS CONSOLE HEPATITIS B SURFACE ANTIGEN: Hepatitis B Surface Ag: NEGATIVE

## 2016-03-15 LAB — OB RESULTS CONSOLE ABO/RH: RH Type: POSITIVE

## 2016-03-15 LAB — OB RESULTS CONSOLE RUBELLA ANTIBODY, IGM: RUBELLA: IMMUNE

## 2016-03-15 LAB — OB RESULTS CONSOLE HIV ANTIBODY (ROUTINE TESTING): HIV: NONREACTIVE

## 2016-05-23 ENCOUNTER — Inpatient Hospital Stay (HOSPITAL_COMMUNITY)
Admission: AD | Admit: 2016-05-23 | Discharge: 2016-05-23 | Disposition: A | Discharge status: Home / Self Care | Payer: Managed Care, Other (non HMO) | Source: Ambulatory Visit | Attending: Obstetrics and Gynecology | Admitting: Obstetrics and Gynecology

## 2016-05-23 ENCOUNTER — Inpatient Hospital Stay (HOSPITAL_COMMUNITY): Payer: Managed Care, Other (non HMO)

## 2016-05-23 ENCOUNTER — Encounter (HOSPITAL_COMMUNITY): Payer: Self-pay | Admitting: *Deleted

## 2016-05-23 DIAGNOSIS — R109 Unspecified abdominal pain: Secondary | ICD-10-CM

## 2016-05-23 DIAGNOSIS — O26892 Other specified pregnancy related conditions, second trimester: Secondary | ICD-10-CM | POA: Diagnosis not present

## 2016-05-23 DIAGNOSIS — Z3A19 19 weeks gestation of pregnancy: Secondary | ICD-10-CM | POA: Insufficient documentation

## 2016-05-23 LAB — URINALYSIS, ROUTINE W REFLEX MICROSCOPIC
Bilirubin Urine: NEGATIVE
Glucose, UA: NEGATIVE mg/dL
Hgb urine dipstick: NEGATIVE
KETONES UR: NEGATIVE mg/dL
Nitrite: NEGATIVE
Protein, ur: NEGATIVE mg/dL
SPECIFIC GRAVITY, URINE: 1.013 (ref 1.005–1.030)
pH: 6 (ref 5.0–8.0)

## 2016-05-23 NOTE — MAU Note (Signed)
c/o  R lower back pain that woke pt at 0100 this AM; pt called on- all nurse and told to come to MAU for evaluation; denies any UTI symptoms; had a very busy day yesterday and was doing housework and laundry all day;

## 2016-05-23 NOTE — Discharge Instructions (Signed)
Muscle Strain A muscle strain is an injury that occurs when a muscle is stretched beyond its normal length. Usually a small number of muscle fibers are torn when this happens. Muscle strain is rated in degrees. First-degree strains have the least amount of muscle fiber tearing and pain. Second-degree and third-degree strains have increasingly more tearing and pain. Usually, recovery from muscle strain takes 1-2 weeks. Complete healing takes 5-6 weeks. What are the causes? Muscle strain happens when a sudden, violent force placed on a muscle stretches it too far. This may occur with lifting, sports, or a fall. What increases the risk? Muscle strain is especially common in athletes. What are the signs or symptoms? At the site of the muscle strain, there may be:  Pain.  Bruising.  Swelling.  Difficulty using the muscle due to pain or lack of normal function.  How is this diagnosed? Your health care provider will perform a physical exam and ask about your medical history. How is this treated? Often, the best treatment for a muscle strain is resting, icing, and applying cold compresses to the injured area. Follow these instructions at home:  Use the PRICE method of treatment to promote muscle healing during the first 2-3 days after your injury. The PRICE method involves: ? Protecting the muscle from being injured again. ? Restricting your activity and resting the injured body part. ? Icing your injury. To do this, put ice in a plastic bag. Place a towel between your skin and the bag. Then, apply the ice and leave it on from 15-20 minutes each hour. After the third day, switch to moist heat packs. ? Apply compression to the injured area with a splint or elastic bandage. Be careful not to wrap it too tightly. This may interfere with blood circulation or increase swelling. ? Elevate the injured body part above the level of your heart as often as you can.  Only take over-the-counter or  prescription medicines for pain, discomfort, or fever as directed by your health care provider.  Warming up prior to exercise helps to prevent future muscle strains. Contact a health care provider if:  You have increasing pain or swelling in the injured area.  You have numbness, tingling, or a significant loss of strength in the injured area. This information is not intended to replace advice given to you by your health care provider. Make sure you discuss any questions you have with your health care provider. Document Released: 12/31/2004 Document Revised: 06/08/2015 Document Reviewed: 07/30/2012 Elsevier Interactive Patient Education  2017 [employer]vier Inc.  

## 2016-05-23 NOTE — MAU Note (Signed)
Pt reports she started having pain in her right flank and lower back that started last night. Pain is constant. Denies nay urinary symptoms or vag bleeding or discharge.

## 2016-05-23 NOTE — MAU Provider Note (Signed)
History     Chief Complaint  Patient presents with  . Flank Pain  25 yo G2P1001 BF @ 19 5/7 weeks presents with c/o right side pain  Since 1 am. Pain was radiating to her back. Pain is constant. (+) nausea (-) vomiting or urinary sx. No vaginal bleeding. Pt tried tylenol and tried to go back to sleep   OB History    Gravida Para Term Preterm AB Living   2 1 1     1    SAB TAB Ectopic Multiple Live Births                  Past Medical History:  Diagnosis Date  . Vaginal Pap smear, abnormal     Past Surgical History:  Procedure Laterality Date  . NO PAST SURGERIES      No family history on file.  Social History  Substance Use Topics  . Smoking status: Never Smoker  . Smokeless tobacco: Never Used  . Alcohol use Yes     Comment: holidays only    Allergies: No Known Allergies  Prescriptions Prior to Admission  Medication Sig Dispense Refill Last Dose  . OVER THE COUNTER MEDICATION Take 1 tablet by mouth as needed (nausea).   05/22/2016 at Unknown time  . Prenatal Vit-Fe Fumarate-FA (PRENATAL MULTIVITAMIN) TABS tablet Take 1 tablet by mouth daily at 12 noon.   05/23/2016 at Unknown time     Physical Exam  LMP  (LMP Unknown)   General appearance: alert, cooperative and no distress Lungs: clear to auscultation bilaterally Heart: regular rate and rhythm, S1, S2 normal, no murmur, click, rub or gallop Abdomen: gravid FH 2FB above umb, right mid lateral tenderness (+) FHR Pelvic: long/closed Extremities: no edema, redness or tenderness in the calves or thighs  Back (-) CVAT  u/a: no heme. squam 6-30 therefore ucx not sent ED Course  IMP: abdominal pain in pregnancy IUP @ 19 5/7 weeks P) abdominal sonogram MDM Addendum: [place] Abdomen Complete  Result Date: 05/23/2016 CLINICAL DATA:  Right-sided flank pain, [redacted] weeks pregnant EXAM: ABDOMEN ULTRASOUND COMPLETE COMPARISON:  None. FINDINGS: Gallbladder: No gallstones or wall thickening visualized. No sonographic Murphy sign  noted by sonographer. Common bile duct: Diameter: 3.5 mm. Liver: No focal lesion identified. Within normal limits in parenchymal echogenicity. IVC: No abnormality visualized. Pancreas: Visualized portion unremarkable. Spleen: Size and appearance within normal limits. Right Kidney: Length: 12.4 cm. Minimal fullness of the collecting system is noted although no frank hydronephrosis is seen. Left Kidney: Length: 13.4 cm. Echogenicity within normal limits. No mass or hydronephrosis visualized. Abdominal aorta: No aneurysm visualized. Other findings: None. IMPRESSION: Mild fullness of the right renal collecting system. No other focal abnormality is noted. Electronically Signed   By: Alcide CleverMark  Lukens M.D.   On: 05/23/2016 11:30  P) d/c home. Keep scheduled OB appt. May use heat to site  Tayson Schnelle A, MD 12:12 PM 05/23/2016

## 2016-07-24 ENCOUNTER — Inpatient Hospital Stay (HOSPITAL_COMMUNITY)
Admission: AD | Admit: 2016-07-24 | Discharge: 2016-07-24 | Disposition: A | Discharge status: Home / Self Care | Payer: Managed Care, Other (non HMO) | Source: Ambulatory Visit | Attending: Obstetrics and Gynecology | Admitting: Obstetrics and Gynecology

## 2016-07-24 DIAGNOSIS — R109 Unspecified abdominal pain: Secondary | ICD-10-CM | POA: Diagnosis present

## 2016-07-24 DIAGNOSIS — Z3A28 28 weeks gestation of pregnancy: Secondary | ICD-10-CM | POA: Insufficient documentation

## 2016-07-24 DIAGNOSIS — O26899 Other specified pregnancy related conditions, unspecified trimester: Secondary | ICD-10-CM

## 2016-07-24 DIAGNOSIS — Z79899 Other long term (current) drug therapy: Secondary | ICD-10-CM | POA: Insufficient documentation

## 2016-07-24 DIAGNOSIS — O26893 Other specified pregnancy related conditions, third trimester: Secondary | ICD-10-CM | POA: Insufficient documentation

## 2016-07-24 DIAGNOSIS — R1030 Lower abdominal pain, unspecified: Secondary | ICD-10-CM | POA: Insufficient documentation

## 2016-07-24 LAB — URINALYSIS, ROUTINE W REFLEX MICROSCOPIC
BILIRUBIN URINE: NEGATIVE
Glucose, UA: NEGATIVE mg/dL
HGB URINE DIPSTICK: NEGATIVE
KETONES UR: NEGATIVE mg/dL
Leukocytes, UA: NEGATIVE
NITRITE: NEGATIVE
Protein, ur: NEGATIVE mg/dL
SPECIFIC GRAVITY, URINE: 1.006 (ref 1.005–1.030)
pH: 6 (ref 5.0–8.0)

## 2016-07-24 NOTE — MAU Note (Addendum)
Pt. States pain was across lower abd.; currently not in pain. EFM adjusted - FHR 140s.

## 2016-07-24 NOTE — MAU Provider Note (Signed)
History     Chief Complaint  Patient presents with  . Abdominal Pain    pulling sharp pain in lower abd. that started around 2315   G2P1001 BF @ 28 5/7 weeks presented by EMS for episode of sharp lower abdominal pain that happened at work. Pain had resolved  Prior to the ambulance arrival but per pt, she came by EMS in case the pain recurred. Pt denies any vaginal bleeding or urinary complaint. (+) FM   OB History    Gravida Para Term Preterm AB Living   2 1 1     1    SAB TAB Ectopic Multiple Live Births                  Past Medical History:  Diagnosis Date  . Vaginal Pap smear, abnormal     Past Surgical History:  Procedure Laterality Date  . NO PAST SURGERIES      No family history on file.  Social History  Substance Use Topics  . Smoking status: Never Smoker  . Smokeless tobacco: Never Used  . Alcohol use Yes     Comment: holidays only    Allergies: No Known Allergies  Prescriptions Prior to Admission  Medication Sig Dispense Refill Last Dose  . OVER THE COUNTER MEDICATION Take 1 tablet by mouth as needed (nausea).   05/22/2016 at Unknown time  . Prenatal Vit-Fe Fumarate-FA (PRENATAL MULTIVITAMIN) TABS tablet Take 1 tablet by mouth daily at 12 noon.   05/23/2016 at Unknown time     Physical Exam   Blood pressure 116/60, pulse 95, temperature 98.3 F (36.8 C), temperature source Oral, resp. rate 17, height 5\' 3"  (1.6 m), weight 78 kg (172 lb), SpO2 100 %.  General appearance: alert, cooperative and no distress Abdomen: gravid suprapubic tenderness Pelvic: closed/long/out of pelvis  Ext no edema or calf tenderness  Tracing: baseline 125-130 (+) accel 160 No ctx  IMP: abdominal pain in pregnancy IUP @ 28 5/7 weeks P) u/a, ucx d/c home. Keep scheduled  OB appt.  ED Course   MDM   Marissa Cross A, MD 2:00 AM 07/24/2016

## 2016-07-24 NOTE — MAU Note (Signed)
Pt. Arrived via EMS. Pt. experiencing sharp lower abd. pain while at work - pain started around 2315 last night (07/23/2016). Last sexual intercourse was yesterday (07/23/2016).  Pt. denies vaginal bleeding, or sudden gush of fluid,  positive for fetal movement. EFM applied - FHR 130s, toco applied - abd. Soft.

## 2016-09-20 LAB — OB RESULTS CONSOLE GBS: GBS: POSITIVE

## 2016-10-14 ENCOUNTER — Inpatient Hospital Stay (HOSPITAL_COMMUNITY)
Admission: AD | Admit: 2016-10-14 | Discharge: 2016-10-18 | DRG: 786 | Disposition: A | Discharge status: Home / Self Care | Payer: Medicaid Other | Source: Ambulatory Visit | Attending: Obstetrics & Gynecology | Admitting: Obstetrics & Gynecology

## 2016-10-14 ENCOUNTER — Encounter (HOSPITAL_COMMUNITY): Payer: Self-pay | Admitting: *Deleted

## 2016-10-14 DIAGNOSIS — O480 Post-term pregnancy: Principal | ICD-10-CM | POA: Diagnosis present

## 2016-10-14 DIAGNOSIS — O4593 Premature separation of placenta, unspecified, third trimester: Secondary | ICD-10-CM | POA: Diagnosis present

## 2016-10-14 DIAGNOSIS — O99824 Streptococcus B carrier state complicating childbirth: Secondary | ICD-10-CM | POA: Diagnosis present

## 2016-10-14 DIAGNOSIS — O9081 Anemia of the puerperium: Secondary | ICD-10-CM | POA: Diagnosis not present

## 2016-10-14 DIAGNOSIS — Z3490 Encounter for supervision of normal pregnancy, unspecified, unspecified trimester: Secondary | ICD-10-CM | POA: Diagnosis present

## 2016-10-14 DIAGNOSIS — Z3A40 40 weeks gestation of pregnancy: Secondary | ICD-10-CM

## 2016-10-14 DIAGNOSIS — D62 Acute posthemorrhagic anemia: Secondary | ICD-10-CM | POA: Diagnosis not present

## 2016-10-14 DIAGNOSIS — O4590 Premature separation of placenta, unspecified, unspecified trimester: Secondary | ICD-10-CM | POA: Diagnosis present

## 2016-10-14 HISTORY — DX: Premature separation of placenta, unspecified, unspecified trimester: O45.90

## 2016-10-14 NOTE — MAU Note (Signed)
Pt here with c/o contractions; denies any bleeding or leaking of fluid. Reports good fetal movement.

## 2016-10-15 ENCOUNTER — Encounter (HOSPITAL_COMMUNITY)
Admission: AD | Disposition: A | Discharge status: Home / Self Care | Payer: Self-pay | Source: Ambulatory Visit | Attending: Obstetrics & Gynecology

## 2016-10-15 ENCOUNTER — Inpatient Hospital Stay (HOSPITAL_COMMUNITY): Payer: Medicaid Other | Admitting: Anesthesiology

## 2016-10-15 ENCOUNTER — Encounter (HOSPITAL_COMMUNITY): Payer: Self-pay | Admitting: Obstetrics & Gynecology

## 2016-10-15 ENCOUNTER — Inpatient Hospital Stay (HOSPITAL_COMMUNITY): Payer: Medicaid Other

## 2016-10-15 DIAGNOSIS — O9081 Anemia of the puerperium: Secondary | ICD-10-CM | POA: Diagnosis not present

## 2016-10-15 DIAGNOSIS — O4590 Premature separation of placenta, unspecified, unspecified trimester: Secondary | ICD-10-CM | POA: Diagnosis not present

## 2016-10-15 DIAGNOSIS — O26893 Other specified pregnancy related conditions, third trimester: Secondary | ICD-10-CM | POA: Diagnosis present

## 2016-10-15 DIAGNOSIS — O99824 Streptococcus B carrier state complicating childbirth: Secondary | ICD-10-CM | POA: Diagnosis present

## 2016-10-15 DIAGNOSIS — O480 Post-term pregnancy: Secondary | ICD-10-CM | POA: Diagnosis present

## 2016-10-15 DIAGNOSIS — Z3490 Encounter for supervision of normal pregnancy, unspecified, unspecified trimester: Secondary | ICD-10-CM | POA: Diagnosis present

## 2016-10-15 DIAGNOSIS — O4593 Premature separation of placenta, unspecified, third trimester: Secondary | ICD-10-CM | POA: Diagnosis present

## 2016-10-15 DIAGNOSIS — D62 Acute posthemorrhagic anemia: Secondary | ICD-10-CM | POA: Diagnosis not present

## 2016-10-15 DIAGNOSIS — Z3A40 40 weeks gestation of pregnancy: Secondary | ICD-10-CM | POA: Diagnosis not present

## 2016-10-15 HISTORY — DX: Premature separation of placenta, unspecified, unspecified trimester: O45.90

## 2016-10-15 LAB — CBC
HCT: 37.2 % (ref 36.0–46.0)
Hemoglobin: 13.3 g/dL (ref 12.0–15.0)
MCH: 30.9 pg (ref 26.0–34.0)
MCHC: 35.8 g/dL (ref 30.0–36.0)
MCV: 86.3 fL (ref 78.0–100.0)
PLATELETS: 195 10*3/uL (ref 150–400)
RBC: 4.31 MIL/uL (ref 3.87–5.11)
RDW: 14.7 % (ref 11.5–15.5)
WBC: 5.8 10*3/uL (ref 4.0–10.5)

## 2016-10-15 LAB — ABO/RH: ABO/RH(D): O POS

## 2016-10-15 LAB — RPR: RPR Ser Ql: NONREACTIVE

## 2016-10-15 LAB — TYPE AND SCREEN
ABO/RH(D): O POS
Antibody Screen: NEGATIVE

## 2016-10-15 SURGERY — Surgical Case
Anesthesia: Regional

## 2016-10-15 MED ORDER — OXYCODONE HCL 5 MG PO TABS
10.0000 mg | ORAL_TABLET | ORAL | Status: DC | PRN
  Administered 2016-10-17: 10 mg via ORAL
  Filled 2016-10-15: qty 2

## 2016-10-15 MED ORDER — EPHEDRINE 5 MG/ML INJ: 10.0000 mg | INTRAVENOUS | Status: DC | PRN

## 2016-10-15 MED ORDER — OXYCODONE-ACETAMINOPHEN 5-325 MG PO TABS
2.0000 | ORAL_TABLET | ORAL | Status: DC | PRN
Start: 2016-10-15 — End: 2016-10-15

## 2016-10-15 MED ORDER — BUTORPHANOL TARTRATE 1 MG/ML IJ SOLN
INTRAMUSCULAR | Status: AC
  Filled 2016-10-15: qty 1

## 2016-10-15 MED ORDER — SOD CITRATE-CITRIC ACID 500-334 MG/5ML PO SOLN
30.0000 mL | ORAL | Status: DC | PRN
  Administered 2016-10-15: 30 mL via ORAL
  Filled 2016-10-15: qty 15

## 2016-10-15 MED ORDER — SCOPOLAMINE 1 MG/3DAYS TD PT72
MEDICATED_PATCH | TRANSDERMAL | Status: DC | PRN
  Administered 2016-10-15: 1 via TRANSDERMAL

## 2016-10-15 MED ORDER — SENNOSIDES-DOCUSATE SODIUM 8.6-50 MG PO TABS
2.0000 | ORAL_TABLET | ORAL | Status: DC
  Administered 2016-10-16 – 2016-10-17 (×2): 2 via ORAL
  Filled 2016-10-15 (×2): qty 2

## 2016-10-15 MED ORDER — OXYTOCIN 40 UNITS IN LACTATED RINGERS INFUSION - SIMPLE MED: 2.5000 [IU]/h | INTRAVENOUS | Status: AC

## 2016-10-15 MED ORDER — LACTATED RINGERS IV SOLN: 500.0000 mL | Freq: Once | INTRAVENOUS | Status: DC

## 2016-10-15 MED ORDER — ACETAMINOPHEN 325 MG PO TABS: 650.0000 mg | ORAL_TABLET | ORAL | Status: DC | PRN

## 2016-10-15 MED ORDER — ONDANSETRON HCL 4 MG/2ML IJ SOLN
4.0000 mg | Freq: Four times a day (QID) | INTRAMUSCULAR | Status: DC | PRN
  Administered 2016-10-16: 4 mg via INTRAVENOUS
  Filled 2016-10-15: qty 2

## 2016-10-15 MED ORDER — LACTATED RINGERS IV SOLN
INTRAVENOUS | Status: DC | PRN
  Administered 2016-10-15: 20:00:00 via INTRAVENOUS

## 2016-10-15 MED ORDER — ONDANSETRON HCL 4 MG/2ML IJ SOLN
INTRAMUSCULAR | Status: DC | PRN
Start: 2016-10-15 — End: 2016-10-15
  Administered 2016-10-15: 4 mg via INTRAVENOUS

## 2016-10-15 MED ORDER — LIDOCAINE-EPINEPHRINE (PF) 2 %-1:200000 IJ SOLN
INTRAMUSCULAR | Status: AC
  Filled 2016-10-15: qty 20

## 2016-10-15 MED ORDER — SIMETHICONE 80 MG PO CHEW
80.0000 mg | CHEWABLE_TABLET | Freq: Three times a day (TID) | ORAL | Status: DC
  Administered 2016-10-16 – 2016-10-18 (×6): 80 mg via ORAL
  Filled 2016-10-15 (×6): qty 1

## 2016-10-15 MED ORDER — OXYTOCIN BOLUS FROM INFUSION: 500.0000 mL | Freq: Once | INTRAVENOUS | Status: DC

## 2016-10-15 MED ORDER — SCOPOLAMINE 1 MG/3DAYS TD PT72
MEDICATED_PATCH | TRANSDERMAL | Status: AC
  Filled 2016-10-15: qty 1

## 2016-10-15 MED ORDER — MENTHOL 3 MG MT LOZG: 1.0000 | LOZENGE | OROMUCOSAL | Status: DC | PRN

## 2016-10-15 MED ORDER — TERBUTALINE SULFATE 1 MG/ML IJ SOLN: 0.2500 mg | Freq: Once | INTRAMUSCULAR | Status: DC | PRN

## 2016-10-15 MED ORDER — BUTORPHANOL TARTRATE 1 MG/ML IJ SOLN
1.0000 mg | Freq: Once | INTRAMUSCULAR | Status: AC
  Administered 2016-10-15: 1 mg via INTRAVENOUS

## 2016-10-15 MED ORDER — TETANUS-DIPHTH-ACELL PERTUSSIS 5-2.5-18.5 LF-MCG/0.5 IM SUSP: 0.5000 mL | Freq: Once | INTRAMUSCULAR | Status: DC

## 2016-10-15 MED ORDER — METHYLERGONOVINE MALEATE 0.2 MG/ML IJ SOLN
INTRAMUSCULAR | Status: AC
  Filled 2016-10-15: qty 1

## 2016-10-15 MED ORDER — OXYTOCIN 40 UNITS IN LACTATED RINGERS INFUSION - SIMPLE MED
1.0000 m[IU]/min | INTRAVENOUS | Status: DC
  Administered 2016-10-15: 2 m[IU]/min via INTRAVENOUS
  Filled 2016-10-15: qty 1000

## 2016-10-15 MED ORDER — PHENYLEPHRINE HCL 10 MG/ML IJ SOLN
INTRAMUSCULAR | Status: DC | PRN
  Administered 2016-10-15 (×3): 80 ug via INTRAVENOUS

## 2016-10-15 MED ORDER — FLEET ENEMA 7-19 GM/118ML RE ENEM: 1.0000 | ENEMA | RECTAL | Status: DC | PRN

## 2016-10-15 MED ORDER — PENICILLIN G POTASSIUM 5000000 UNITS IJ SOLR
5.0000 10*6.[IU] | Freq: Once | INTRAVENOUS | Status: AC
  Administered 2016-10-15: 5 10*6.[IU] via INTRAVENOUS
  Filled 2016-10-15: qty 5

## 2016-10-15 MED ORDER — FENTANYL 2.5 MCG/ML BUPIVACAINE 1/10 % EPIDURAL INFUSION (WH - ANES)
14.0000 mL/h | INTRAMUSCULAR | Status: DC | PRN
  Administered 2016-10-15: 14 mL/h via EPIDURAL
  Filled 2016-10-15: qty 100

## 2016-10-15 MED ORDER — ONDANSETRON HCL 4 MG/2ML IJ SOLN
INTRAMUSCULAR | Status: AC
  Filled 2016-10-15: qty 2

## 2016-10-15 MED ORDER — LACTATED RINGERS IV SOLN: 500.0000 mL | INTRAVENOUS | Status: DC | PRN

## 2016-10-15 MED ORDER — DEXAMETHASONE SODIUM PHOSPHATE 4 MG/ML IJ SOLN
INTRAMUSCULAR | Status: AC
  Filled 2016-10-15: qty 1

## 2016-10-15 MED ORDER — SODIUM CHLORIDE 0.9 % IR SOLN
Status: DC | PRN
  Administered 2016-10-15: 1

## 2016-10-15 MED ORDER — SIMETHICONE 80 MG PO CHEW
80.0000 mg | CHEWABLE_TABLET | ORAL | Status: DC
  Administered 2016-10-16 – 2016-10-17 (×2): 80 mg via ORAL
  Filled 2016-10-15 (×2): qty 1

## 2016-10-15 MED ORDER — DIPHENHYDRAMINE HCL 25 MG PO CAPS
25.0000 mg | ORAL_CAPSULE | Freq: Four times a day (QID) | ORAL | Status: DC | PRN
  Administered 2016-10-16: 25 mg via ORAL
  Filled 2016-10-15: qty 1

## 2016-10-15 MED ORDER — MORPHINE SULFATE (PF) 0.5 MG/ML IJ SOLN
INTRAMUSCULAR | Status: DC | PRN
  Administered 2016-10-15: 2 mg via EPIDURAL
  Administered 2016-10-15: 1 mg via INTRAVENOUS
  Administered 2016-10-15: 2 mg via EPIDURAL

## 2016-10-15 MED ORDER — PHENYLEPHRINE 40 MCG/ML (10ML) SYRINGE FOR IV PUSH (FOR BLOOD PRESSURE SUPPORT): 80.0000 ug | PREFILLED_SYRINGE | INTRAVENOUS | Status: DC | PRN

## 2016-10-15 MED ORDER — MORPHINE SULFATE (PF) 0.5 MG/ML IJ SOLN
INTRAMUSCULAR | Status: AC
  Filled 2016-10-15: qty 10

## 2016-10-15 MED ORDER — LACTATED RINGERS IV SOLN
INTRAVENOUS | Status: DC
  Administered 2016-10-16: 01:00:00 via INTRAVENOUS

## 2016-10-15 MED ORDER — DIPHENHYDRAMINE HCL 50 MG/ML IJ SOLN: 12.5000 mg | INTRAMUSCULAR | Status: DC | PRN

## 2016-10-15 MED ORDER — DEXAMETHASONE SODIUM PHOSPHATE 4 MG/ML IJ SOLN
INTRAMUSCULAR | Status: DC | PRN
  Administered 2016-10-15: 4 mg via INTRAVENOUS

## 2016-10-15 MED ORDER — ONDANSETRON HCL 4 MG/2ML IJ SOLN: 4.0000 mg | Freq: Four times a day (QID) | INTRAMUSCULAR | Status: DC | PRN

## 2016-10-15 MED ORDER — LIDOCAINE HCL (PF) 1 % IJ SOLN: 30.0000 mL | INTRAMUSCULAR | Status: DC | PRN

## 2016-10-15 MED ORDER — OXYTOCIN 10 UNIT/ML IJ SOLN
INTRAMUSCULAR | Status: AC
  Filled 2016-10-15: qty 1

## 2016-10-15 MED ORDER — SIMETHICONE 80 MG PO CHEW
80.0000 mg | CHEWABLE_TABLET | ORAL | Status: DC | PRN
Start: 2016-10-15 — End: 2016-10-18

## 2016-10-15 MED ORDER — PHENYLEPHRINE 40 MCG/ML (10ML) SYRINGE FOR IV PUSH (FOR BLOOD PRESSURE SUPPORT)
80.0000 ug | PREFILLED_SYRINGE | INTRAVENOUS | Status: DC | PRN
  Filled 2016-10-15: qty 10

## 2016-10-15 MED ORDER — OXYCODONE-ACETAMINOPHEN 5-325 MG PO TABS: 1.0000 | ORAL_TABLET | ORAL | Status: DC | PRN

## 2016-10-15 MED ORDER — OXYTOCIN 40 UNITS IN LACTATED RINGERS INFUSION - SIMPLE MED: 2.5000 [IU]/h | INTRAVENOUS | Status: DC

## 2016-10-15 MED ORDER — SODIUM BICARBONATE 8.4 % IV SOLN
INTRAVENOUS | Status: DC | PRN
  Administered 2016-10-15: 10 mL via EPIDURAL

## 2016-10-15 MED ORDER — ALBUMIN HUMAN 5 % IV SOLN
INTRAVENOUS | Status: DC | PRN
  Administered 2016-10-15: 21:00:00 via INTRAVENOUS

## 2016-10-15 MED ORDER — CEFAZOLIN SODIUM-DEXTROSE 2-4 GM/100ML-% IV SOLN
INTRAVENOUS | Status: AC
  Filled 2016-10-15: qty 100

## 2016-10-15 MED ORDER — LACTATED RINGERS IV SOLN
INTRAVENOUS | Status: DC
  Administered 2016-10-15 (×4): via INTRAVENOUS
  Administered 2016-10-15: 125 mL/h via INTRAVENOUS

## 2016-10-15 MED ORDER — METHYLERGONOVINE MALEATE 0.2 MG/ML IJ SOLN
INTRAMUSCULAR | Status: DC | PRN
  Administered 2016-10-15: 0.2 mg via INTRAMUSCULAR

## 2016-10-15 MED ORDER — LIDOCAINE HCL (PF) 1 % IJ SOLN
INTRAMUSCULAR | Status: DC | PRN
  Administered 2016-10-15 (×2): 5 mL

## 2016-10-15 MED ORDER — OXYCODONE HCL 5 MG PO TABS: 5.0000 mg | ORAL_TABLET | ORAL | Status: DC | PRN

## 2016-10-15 MED ORDER — CEFAZOLIN SODIUM-DEXTROSE 2-3 GM-% IV SOLR
INTRAVENOUS | Status: DC | PRN
  Administered 2016-10-15: 2 g via INTRAVENOUS

## 2016-10-15 MED ORDER — IBUPROFEN 600 MG PO TABS
600.0000 mg | ORAL_TABLET | Freq: Four times a day (QID) | ORAL | Status: DC
  Administered 2016-10-16 – 2016-10-18 (×9): 600 mg via ORAL
  Filled 2016-10-15 (×9): qty 1

## 2016-10-15 MED ORDER — PRENATAL MULTIVITAMIN CH
1.0000 | ORAL_TABLET | Freq: Every day | ORAL | Status: DC
  Administered 2016-10-16 – 2016-10-18 (×3): 1 via ORAL
  Filled 2016-10-15 (×3): qty 1

## 2016-10-15 MED ORDER — PENICILLIN G POT IN DEXTROSE 60000 UNIT/ML IV SOLN
3.0000 10*6.[IU] | INTRAVENOUS | Status: DC
  Administered 2016-10-15 (×4): 3 10*6.[IU] via INTRAVENOUS
  Filled 2016-10-15 (×6): qty 50

## 2016-10-15 MED ORDER — OXYTOCIN 10 UNIT/ML IJ SOLN
INTRAVENOUS | Status: DC | PRN
  Administered 2016-10-15: 40 [IU] via INTRAVENOUS

## 2016-10-15 MED ORDER — COCONUT OIL OIL
1.0000 "application " | TOPICAL_OIL | Status: DC | PRN
  Administered 2016-10-18: 1 via TOPICAL
  Filled 2016-10-15: qty 120

## 2016-10-15 SURGICAL SUPPLY — 36 items
BENZOIN TINCTURE PRP APPL 2/3 (GAUZE/BANDAGES/DRESSINGS) ×2 IMPLANT
CHLORAPREP W/TINT 26ML (MISCELLANEOUS) ×2 IMPLANT
CLAMP CORD UMBIL (MISCELLANEOUS) IMPLANT
CLOTH BEACON ORANGE TIMEOUT ST (SAFETY) ×2 IMPLANT
CONTAINER PREFILL 10% NBF 15ML (MISCELLANEOUS) IMPLANT
DRSG OPSITE POSTOP 4X10 (GAUZE/BANDAGES/DRESSINGS) ×4 IMPLANT
ELECT REM PT RETURN 9FT ADLT (ELECTROSURGICAL) ×2
ELECTRODE REM PT RTRN 9FT ADLT (ELECTROSURGICAL) ×1 IMPLANT
EXTRACTOR VACUUM KIWI (MISCELLANEOUS) IMPLANT
EXTRACTOR VACUUM M CUP 4 TUBE (SUCTIONS) IMPLANT
GAUZE SPONGE 4X4 12PLY STRL LF (GAUZE/BANDAGES/DRESSINGS) ×2 IMPLANT
GLOVE BIO SURGEON STRL SZ7 (GLOVE) ×2 IMPLANT
GLOVE BIOGEL PI IND STRL 7.0 (GLOVE) ×2 IMPLANT
GLOVE BIOGEL PI INDICATOR 7.0 (GLOVE) ×2
GOWN STRL REUS W/TWL LRG LVL3 (GOWN DISPOSABLE) ×4 IMPLANT
KIT ABG SYR 3ML LUER SLIP (SYRINGE) IMPLANT
NEEDLE HYPO 25X5/8 SAFETYGLIDE (NEEDLE) IMPLANT
NS IRRIG 1000ML POUR BTL (IV SOLUTION) ×2 IMPLANT
PACK C SECTION WH (CUSTOM PROCEDURE TRAY) ×2 IMPLANT
PAD ABD 7.5X8 STRL (GAUZE/BANDAGES/DRESSINGS) ×2 IMPLANT
PAD OB MATERNITY 4.3X12.25 (PERSONAL CARE ITEMS) ×2 IMPLANT
RTRCTR C-SECT PINK 25CM LRG (MISCELLANEOUS) IMPLANT
SPONGE LAP 18X18 X RAY DECT (DISPOSABLE) ×2 IMPLANT
STRIP CLOSURE SKIN 1/2X4 (GAUZE/BANDAGES/DRESSINGS) ×2 IMPLANT
SUT MON AB-0 CT1 36 (SUTURE) ×4 IMPLANT
SUT PLAIN 0 NONE (SUTURE) IMPLANT
SUT PLAIN 2 0 (SUTURE) ×1
SUT PLAIN ABS 2-0 CT1 27XMFL (SUTURE) ×1 IMPLANT
SUT VIC AB 0 CT1 27 (SUTURE) ×2
SUT VIC AB 0 CT1 27XBRD ANBCTR (SUTURE) ×2 IMPLANT
SUT VIC AB 2-0 CT1 27 (SUTURE) ×1
SUT VIC AB 2-0 CT1 TAPERPNT 27 (SUTURE) ×1 IMPLANT
SUT VIC AB 4-0 KS 27 (SUTURE) ×2 IMPLANT
SUT VICRYL 0 TIES 12 18 (SUTURE) IMPLANT
TOWEL OR 17X24 6PK STRL BLUE (TOWEL DISPOSABLE) ×2 IMPLANT
TRAY FOLEY BAG SILVER LF 14FR (SET/KITS/TRAYS/PACK) IMPLANT

## 2016-10-15 NOTE — Transfer of Care (Signed)
Immediate Anesthesia Transfer of Care Note  Patient: Marissa Cross  Procedure(s) Performed: CESAREAN SECTION (N/A )  Patient Location: PACU  Anesthesia Type:Epidural  Level of Consciousness: awake, alert  and oriented  Airway & Oxygen Therapy: Patient Spontanous Breathing  Post-op Assessment: Report given to RN and Post -op Vital signs reviewed and stable  Post vital signs: Reviewed and stable  Last Vitals:  Vitals:   10/15/16 1901 10/15/16 1930  BP: 109/64 111/68  Pulse: 84 97  Resp: 16 18  Temp:      Last Pain:  Vitals:   10/15/16 1930  TempSrc:   PainSc: 8       Patients Stated Pain Goal: 6 (10/15/16 1530)  Complications: No apparent anesthesia complications

## 2016-10-15 NOTE — H&P (Signed)
Marissa Cross is a 26 y.o. female  G2P1001 at 67.3 presented to MAU for eval for r/o labor.  During evaluation 1 min late deceleration noted.  BPP 8/8 and afi 8cm.  I reviewed concern for late decel at her gestational age and recommendation for IOL as more risk to baby to continue pregnancy than for delivery.  She is very agreeable for IOL.  She denies any LOF or vb.  +FM.  Antepartum course uncomplicated. She had considered BTL but then found out it was permanent and doesn't want that.  Will consider LARC options.  P[facility]are at [facility] since 10 wks, dated by 7 wk u/s History OB History    Gravida Para Term Preterm AB Living   SAB TAB Ectopic Multiple Live Births                 Past Medical History:  Diagnosis Date  . Vaginal Pap smear, abnormal    Past Surgical History:  Procedure Laterality Date  . NO PAST SURGERIES     Family History: family history is not on file. Social History:  reports that she has never smoked. She has never used smokeless tobacco. She reports that she drinks alcohol. She reports that she does not use drugs.  ROS  Dilation: 3 Effacement (%): 50 Station: -3 Exam by:: Dr. Amado Nash Blood pressure 110/71, pulse 81, temperature 98.4 F (36.9 C), temperature source Oral, resp. rate 18, height  (1.6 m), weight 186 lb (84.4 kg).  Patient Vitals for the past 24 hrs:  BP Temp Temp src Pulse Resp Height Weight  10/15/16 0535 110/71 - - 81 - - -  10/15/16 0309 111/64 98.4 F (36.9 C) Oral 82 18 - -  10/14/16 2331 117/68 98.5 F (36.9 C) Oral 95 15  (1.6 m) 186 lb (84.4 kg)  10/14/16 2319 - - - - -  (1.6 m) 186 lb (84.4 kg)    Exam Physical Exam  Prenatal labs: ABO, Rh: --/--/O POS, O POS (10/02 0230) Antibody: NEG (10/02 0230) Rubella: Immune (03/02 0000) RPR: Nonreactive (03/02 0000)  HBsAg: Negative (03/02 0000)  HIV: Non-reactive (03/02 0000)  GBS: Positive (09/07 0000)  1 hr Glucola - failed 1 hr, passed 3  hr Genetic screening -  Anatomy US - normal  Physical Exam:  A&O x 3 HEENT: wnl  Lungs : ctab CV : rrr Abdo : soft, nt; efw: 7 1/2lbs Extr : no edema, nt bilat LE Pelvic : 3/50/-3; posterior; cephalic  FHT: 120s, normal variability, +accelerations, late decel about during obs, subsequent accelerations; ?early deceleration  Toco: irregular - appear q 4-8 min  CBC    Component Value Date/Time   WBC 5.8 10/15/2016 0230   RBC 4.31 10/15/2016 0230   HGB 13.3 10/15/2016 0230   HCT 37.2 10/15/2016 0230   PLT 195 10/15/2016 0230   MCV 86.3 10/15/2016 0230   MCH 30.9 10/15/2016 0230   MCHC 35.8 10/15/2016 0230   RDW 14.7 10/15/2016 0230   LYMPHSABS 330 (L) 02/21/2016 1502   MONOABS 275 02/21/2016 1502   EOSABS 0 (L) 02/21/2016 1502   BASOSABS 0 02/21/2016 1502   A/p: iup at 40.3 1. Non-reassuring tracing post dates - admit; uncertain if patient has had cervical change since presentation or if variation in examiners (difficult exam); will recheck in 1 hour and begin pitocin then; suspect patient will require pitocin IOL vs active labor 2.  Fetal status overall reassuring, follow closely 3. gbs pos - pcn for prophylaxis 4. Rh pos, rubella immune   Vick Frees 10/15/2016, 6:08 AM

## 2016-10-15 NOTE — Op Note (Signed)
Cesarean Section Procedure Note 10/15/2016  Marissa Cross  Indications: Fetal Distress   Procedure: Emergency Low Transverse Cesarean Section  Pre-operative Diagnosis: Primary cesarean section for fetal bradycardia.   Post-operative Diagnosis: Placental abruption  Surgeon: Shea Evans, MD - Primary   Assistants:   Anesthesia: Epidural   Procedure Details:  The patient was transferred to the OR by another private MD due to fetal bradycardia at 10 minutes while I was en route. Upon arrival I found the patient on the Operating Room table, CRNA checking epidural level, circulating RN noted FHR in 100-107 range. Cervix change was unchanged, still 6-7 cm with high station. Betadine splashed and procedure confirmed, patient gave verbal consent to proceed. Patient covered with surgical drapes. Foley was draining. A Pfannenstiel Incision was made and carried down through the subcutaneous tissue to the fascia. Fascial incision was made and extended transversely. The fascia was separated from the underlying rectus tissue superiorly and inferiorly. The peritoneum was identified and entered. Peritoneal incision was extended longitudinally. The utero-vesical peritoneal reflection and lower segment were incised transversely together carefully staying away from bladder reflection.  Copious amount of clots expelled from the incision consistent with placental abruption. Baby GIRL was in cephalic presentation and delivered easily. Vigorous cry noted after stimulating the baby. Since baby appeared well, delayed cord clamping done in agreement with NICU team and cord cut and baby was handed to NICU team in attendance. Apgar scores of 8 at one minute and 9 at five minutes. Cord was very thin and long. 3 vessel cord noted but arteries were collapsed, so venous cord ph was sent. There was not much cord blood remaining for evaluation. The placenta was removed Intact and appeared abnormal - with abruption involving 1/3rd  of the surface. . The uterine outline noted to have bleeding sinuses in the lower segment but there were no extensions. Tubes and ovaries appeared normal. Methergine 0.2 mg IM given for lower segment atony and it helped. The uterine incision was closed with running locked sutures of 0-Monocryl followed by a second imbricating layer. Hemostasis was achieved. Lavage was carried out until clear. Retractor removed. Peritoneal closure done with 2-0 Vicryl. The fascia was then reapproximated with running sutures of 0-Vicryl. The subcutaneous tissue closure was performed using 2-0 Plain.  The skin was closed with 4-0 Vicryl. Steristrips, honeycomb and pressure dressings placed.    Instrument, sponge, and needle counts were correct prior the abdominal closure and were correct at the conclusion of the case.    Findings: In OR at 8.04 pm per records. Allis test to check level at 8.09 pm and delivery of baby was at 8.11 pm.  Placental abruption. GIRL delivered at 8.11 pm, with APGARs 8 at 1 minute and 9 at 5 minutes. Thin cord. Normal uterus, and bilateral tubes and ovaries.    Estimated Blood Loss: 1864  Total IV Fluids: 2000 ml LR and one Albumin  Urine Output: 150CC OF clear urine  Specimens: Cord gas. Placenta   Complications: no complications  Disposition: PACU - hemodynamically stable.    Baby condition / location:  Couplet care / Skin to Skin  Attending Attestation: I performed the procedure.   Signed: Surgeon(s): Shea Evans, MD

## 2016-10-15 NOTE — Anesthesia Preprocedure Evaluation (Signed)

## 2016-10-15 NOTE — Anesthesia Pain Management Evaluation Note (Signed)
  CRNA Pain Management Visit Note  Patient: Marissa Cross, 26 y.o., female  "Hello I am a member of the anesthesia team at [facility]. We have an anesthesia team available at all times to provide care throughout the hospital, including epidural management and anesthesia for C-section. I don't know your plan for the delivery whether it a natural birth, water birth, IV sedation, nitrous supplementation, doula or epidural, but we want to meet your pain goals."   1.Was your pain managed to your expectations on prior hospitalizations?   Yes   2.What is your expectation for pain management during this hospitalization?     Epidural  3.How can we help you reach that goal?   Record the patient's initial score and the patient's pain goal.   Pain: 8  Pain Goal: 10 [facility] wants you to be able to say your pain was always managed very well.  Laban Emperor 10/15/2016

## 2016-10-15 NOTE — Anesthesia Procedure Notes (Signed)
Epidural Patient location during procedure: OB  Staffing Anesthesiologist: Melvena Vink Performed: anesthesiologist   Preanesthetic Checklist Completed: patient identified, site marked, surgical consent, pre-op evaluation, timeout performed, IV checked, risks and benefits discussed and monitors and equipment checked  Epidural Patient position: sitting Prep: DuraPrep Patient monitoring: heart rate, continuous pulse ox and blood pressure Approach: right paramedian Location: L3-L4 Injection technique: LOR saline  Needle:  Needle type: Tuohy  Needle gauge: 17 G Needle length: 9 cm and 9 Needle insertion depth: 6 cm Catheter type: closed end flexible Catheter size: 20 Guage Catheter at skin depth: 10 cm Test dose: negative  Assessment Events: blood not aspirated, injection not painful, no injection resistance, negative IV test and no paresthesia  Additional Notes Patient identified. Risks/Benefits/Options discussed with patient including but not limited to bleeding, infection, nerve damage, paralysis, failed block, incomplete pain control, headache, blood pressure changes, nausea, vomiting, reactions to medication both or allergic, itching and postpartum back pain. Confirmed with bedside nurse the patient's most recent platelet count. Confirmed with patient that they are not currently taking any anticoagulation, have any bleeding history or any family history of bleeding disorders. Patient expressed understanding and wished to proceed. All questions were answered. Sterile technique was used throughout the entire procedure. Please see nursing notes for vital signs. Test dose was given through epidural needle and negative prior to continuing to dose epidural or start infusion. Warning signs of high block given to the patient including shortness of breath, tingling/numbness in hands, complete motor block, or any concerning symptoms with instructions to call for help. Patient was given  instructions on fall risk and not to get out of bed. All questions and concerns addressed with instructions to call with any issues.     

## 2016-10-15 NOTE — Progress Notes (Signed)
Beginning to feel contractions stronger and more often Blood pressure (!) 105/59, pulse 88, temperature 98.5 F (36.9 C), temperature source Oral, resp. rate 16, height  (1.6 m), weight 186 lb (84.4 kg).   A&ox3 Normal respirations Abd: soft, nt, gravid Cx: 3/70/-3, more mid-position, cephalic AROM, small clear fluid   FHT: 120s, normal variability, +accels,, no decels - category I TOCO: about q 2-5 min, irreg  A/p: iup at 40.3wga 1.  Contin pitocin IOL 2. gbs pos- contin pcn 3. Rubella immune 4. Rh pos 5. Fetal status overall reassuring, continue  Close obs Working towards SVD  V.Andrue Dini MD

## 2016-10-15 NOTE — Progress Notes (Signed)
Beginning to feel contractions stronger and more often  Patient Vitals for the past 24 hrs:  BP Temp Temp src Pulse Resp Height Weight  10/15/16 1230 91/71 - - 78 - - -  10/15/16 1201 (!) 91/51 - - 84 - - -  10/15/16 1156 (!) 94/47 - - 82 16 - -  10/15/16 1131 (!) 88/39 - - 90 16 - -  10/15/16 1122 (!) 91/44 - - 86 16 - -  10/15/16 1001 (!) 102/49 - - (!) 111 16 - -  10/15/16 0931 111/62 - - (!) 103 16 - -  10/15/16 0905 (!) 104/54 - - 100 16 - -  10/15/16 0816 114/68 - - 91 16 - -  10/15/16 0733 134/68 - - 79 16 - -  10/15/16 0655 112/68 98.5 F (36.9 C) Oral 84 16 - -  10/15/16 0535 110/71 - - 81 - - -  10/15/16 0309 111/64 98.4 F (36.9 C) Oral 82 18 - -  10/14/16 2331 117/68 98.5 F (36.9 C) Oral 95 15  (1.6 m) 186 lb (84.4 kg)  10/14/16 2319 - - - - -  (1.6 m) 186 lb (84.4 kg)   A&ox3 Normal respirations Abd: soft, nt, gravid Cx: 3/70/-3, more mid-position, cephalic  FHT: 120s, normal variability, +accels, occ early decel,variables; resolved after position change; +subsequent accels TOCO: about q 2-5 min, irreg  A/p: iup at 40.3wga 1.  Contin pitocin IOL; now at 34mU/min, plan svd 2. gbs pos- contin pcn 3. Rubella immune 4. Rh pos 5. Fetal status overall reassuring, contin  Close obs

## 2016-10-16 ENCOUNTER — Encounter (HOSPITAL_COMMUNITY): Payer: Self-pay | Admitting: Obstetrics & Gynecology

## 2016-10-16 LAB — CBC
HCT: 26.6 % — ABNORMAL LOW (ref 36.0–46.0)
HEMOGLOBIN: 9.6 g/dL — AB (ref 12.0–15.0)
MCH: 31.6 pg (ref 26.0–34.0)
MCHC: 36.1 g/dL — ABNORMAL HIGH (ref 30.0–36.0)
MCV: 87.5 fL (ref 78.0–100.0)
PLATELETS: 162 10*3/uL (ref 150–400)
RBC: 3.04 MIL/uL — AB (ref 3.87–5.11)
RDW: 14.6 % (ref 11.5–15.5)
WBC: 11.5 10*3/uL — AB (ref 4.0–10.5)

## 2016-10-16 MED ORDER — NALOXONE HCL 0.4 MG/ML IJ SOLN: 0.4000 mg | INTRAMUSCULAR | Status: DC | PRN

## 2016-10-16 MED ORDER — SCOPOLAMINE 1 MG/3DAYS TD PT72
1.0000 | MEDICATED_PATCH | Freq: Once | TRANSDERMAL | Status: DC
  Filled 2016-10-16: qty 1

## 2016-10-16 MED ORDER — NALBUPHINE HCL 10 MG/ML IJ SOLN: 5.0000 mg | Freq: Once | INTRAMUSCULAR | Status: AC | PRN

## 2016-10-16 MED ORDER — NALBUPHINE HCL 10 MG/ML IJ SOLN
5.0000 mg | Freq: Once | INTRAMUSCULAR | Status: AC | PRN
  Administered 2016-10-16: 5 mg via SUBCUTANEOUS
  Filled 2016-10-16: qty 1

## 2016-10-16 MED ORDER — LACTATED RINGERS IV SOLN
INTRAVENOUS | Status: DC
  Administered 2016-10-16: 07:00:00 via INTRAVENOUS

## 2016-10-16 MED ORDER — DIPHENHYDRAMINE HCL 50 MG/ML IJ SOLN: 12.5000 mg | INTRAMUSCULAR | Status: DC | PRN

## 2016-10-16 MED ORDER — NALBUPHINE HCL 10 MG/ML IJ SOLN: 5.0000 mg | INTRAMUSCULAR | Status: DC | PRN

## 2016-10-16 MED ORDER — NALOXONE HCL 2 MG/2ML IJ SOSY
1.0000 ug/kg/h | PREFILLED_SYRINGE | INTRAVENOUS | Status: DC | PRN
  Filled 2016-10-16: qty 2

## 2016-10-16 MED ORDER — ONDANSETRON HCL 4 MG/2ML IJ SOLN: 4.0000 mg | Freq: Three times a day (TID) | INTRAMUSCULAR | Status: DC | PRN

## 2016-10-16 MED ORDER — DIPHENHYDRAMINE HCL 25 MG PO CAPS: 25.0000 mg | ORAL_CAPSULE | ORAL | Status: DC | PRN

## 2016-10-16 MED ORDER — SODIUM CHLORIDE 0.9% FLUSH: 3.0000 mL | INTRAVENOUS | Status: DC | PRN

## 2016-10-16 NOTE — Anesthesia Postprocedure Evaluation (Signed)
Anesthesia Post Note  Patient: Marissa Cross  Procedure(s) Performed: CESAREAN SECTION (N/A )     Patient location during evaluation: PACU Anesthesia Type: Epidural Level of consciousness: awake and alert Pain management: pain level controlled Vital Signs Assessment: post-procedure vital signs reviewed and stable Respiratory status: spontaneous breathing and respiratory function stable Cardiovascular status: blood pressure returned to baseline and stable Postop Assessment: spinal receding Anesthetic complications: no    Last Vitals:  Vitals:   10/15/16 2215 10/15/16 2230  BP: 115/65 104/64  Pulse: 82 90  Resp: 19 19  Temp: 36.9 C   SpO2: 96% 97%    Last Pain:  Vitals:   10/15/16 2230  TempSrc:   PainSc: 0-No pain   Pain Goal: Patients Stated Pain Goal: 6 (10/15/16 1530)               Heather Roberts DANIEL

## 2016-10-16 NOTE — Addendum Note (Signed)
Addendum  created 10/16/16 0801 by Orlie Pollen, CRNA   Sign clinical note

## 2016-10-16 NOTE — Plan of Care (Signed)
Problem: Activity: Goal: Risk for activity intolerance will decrease Outcome: Completed/Met Date Met: 10/16/16 Discussed with patient the importance of increased ambulation today and ambulation in hallway at least three times a day. Patient has began to move in room independently well.   Problem: Fluid Volume: Goal: Ability to maintain a balanced intake and output will improve Patient has improved significantly throughout the day increasing her diet with no nausea.

## 2016-10-16 NOTE — Lactation Note (Signed)
This note was copied from a baby's chart. Lactation Consultation Note Mom sick had 300 ml emesis when entered rm. Told mom LC would come back later when feeling better. Mom stated she BF her oldest child for 1 yr. Encouraged to call for assistance if needed when feeling better.  Patient Name: Marissa Cross FAOZH'Y Date: 10/16/2016     Maternal Data    Feeding Feeding Type: Breast Fed Length of feed: 25 min  LATCH Score                   Interventions    Lactation Tools Discussed/Used     Consult Status      Marissa Cross G 10/16/2016, 2:00 AM

## 2016-10-16 NOTE — Progress Notes (Signed)
Mother removed pressure dressing in shower as instructed by RN; however, honeycomb was pulled halfway off by pressure dressing tape. Called Marlinda Mike, CNM for order to replace honeycomb. Tanya ordered to replace honeycomb and stated that NSL may be removed. Earl Gala, Linda Hedges [place]

## 2016-10-16 NOTE — Progress Notes (Signed)
Upon receiving report at beginning of shift, patient complaining of severe itching. Discussed with patient her options of medications that could be given. Patient states she does not desire benadryl because she does not want to fall asleep. Discussed with patient that nubain will not cause her to be sleepy. Then patient stated that she would like to fall asleep; however, she wanted to try to drink some fluids and attempt to eat first. Offered nubain once again; however patient stated she wanted to wait until she spoke with doctor and ate just in case it did cause drowsiness. Patient very indecisive. Encouraged patient to let this RN know when she would like medication for her itching. Spoke with patient multiple times since and she is still itching; however, states that she would still like to wait for medications at this time. Will continue to monitor. Earl Gala, Linda Hedges [place]

## 2016-10-16 NOTE — Progress Notes (Signed)
POSTOPERATIVE DAY # 1 S/P CS for abruption  S:         Reports feeling better this am since breakfast              Tolerating po intake / no nausea or vomiting since breakfast / no flatus / no BM             Bleeding is light             Pain controlled with long-acting narcotic             Wants shower asap - "itching is miserable" - dizziness earlier but none at present sitting up in bed  O:  VS: BP (!) 106/59 (BP Location: Right Arm)   Pulse 79   Temp 98.3 F (36.8 C) (Oral)   Resp 20   Ht  (1.6 m)   Wt 84.4 kg (186 lb)   LMP  (LMP Unknown)   SpO2 96%   Breastfeeding? Unknown   BMI 32.95 kg/m    LABS:               Recent Labs  10/15/16 0230 10/16/16 0532  WBC 5.8 11.5*  HGB 13.3 9.6*  PLT 195 162               Bloodtype: --/--/O POS, O POS (10/02 0230)  Rubella: Immune             tdap 2018             No flu vaccine yet                                  I&O: net negative 964             Physical Exam:             Alert and Oriented X3, scratching constantly during visit  Lungs: Clear and unlabored  Heart: regular rate and rhythm / no mumurs  Abdomen: soft, non-tender, non-distended              Fundus: firm, non-tender, Ueven             Dressing pressure dressing without drainage          Lochia: light  Extremities: no edema, no calf pain or tenderness, SCD in place  A:        POD # 1 S/P CS - abruption            Acute blood loss anemia            Severe itching post-op  P:        Routine postoperative care              Check orthostatic VS this am             Start iron and magnesium              Benadryl and Nubain for itching today - shower when stable with activity progression   Marlinda Mike CNM, MSN, FACNM 10/16/2016, 9:00 AM

## 2016-10-16 NOTE — Addendum Note (Signed)
Addendum  created 10/16/16 0158 by Heather Roberts, MD   Order list changed, Order sets accessed

## 2016-10-16 NOTE — Anesthesia Postprocedure Evaluation (Signed)
Anesthesia Post Note  Patient: Marissa Cross  Procedure(s) Performed: CESAREAN SECTION (N/A )     Patient location during evaluation: Mother Baby Anesthesia Type: Epidural Level of consciousness: awake and alert Pain management: pain level controlled Vital Signs Assessment: post-procedure vital signs reviewed and stable Respiratory status: spontaneous breathing, nonlabored ventilation and respiratory function stable Cardiovascular status: stable Postop Assessment: no headache, no backache and epidural receding Anesthetic complications: no    Last Vitals:  Vitals:   10/16/16 0000 10/16/16 0330  BP: (!) 106/59   Pulse: 79   Resp: 20   Temp: 36.7 C 36.8 C  SpO2: 97% 96%    Last Pain:  Vitals:   10/16/16 0330  TempSrc: Oral  PainSc: 3    Pain Goal: Patients Stated Pain Goal: 6 (10/15/16 1530)               Marrion Coy

## 2016-10-17 MED ORDER — INFLUENZA VAC SPLIT QUAD 0.5 ML IM SUSY: 0.5000 mL | PREFILLED_SYRINGE | INTRAMUSCULAR | Status: DC

## 2016-10-17 MED ORDER — POLYSACCHARIDE IRON COMPLEX 150 MG PO CAPS
150.0000 mg | ORAL_CAPSULE | Freq: Every day | ORAL | Status: DC
  Administered 2016-10-17 – 2016-10-18 (×2): 150 mg via ORAL
  Filled 2016-10-17 (×2): qty 1

## 2016-10-17 MED ORDER — MAGNESIUM OXIDE 400 (241.3 MG) MG PO TABS
400.0000 mg | ORAL_TABLET | Freq: Every day | ORAL | Status: DC
  Administered 2016-10-17 – 2016-10-18 (×2): 400 mg via ORAL
  Filled 2016-10-17 (×2): qty 1

## 2016-10-17 NOTE — Progress Notes (Signed)
POSTOPERATIVE DAY # 2 S/P CS-abruption   S:         Reports feeling much better today - no itching / some sharp pain right lower incision             Tolerating po intake / no nausea / no vomiting / + flatus / no BM             Bleeding is light             Pain controlled with motrin and oxycodone             Up ad lib / ambulatory/ voiding QS  Newborn breast feeding    O:  VS: BP 99/81 (BP Location: Right Arm)   Pulse 95   Temp 98.8 F (37.1 C) (Oral)   Resp 18   Ht  (1.6 m)   Wt 84.4 kg (186 lb)   LMP  (LMP Unknown)   SpO2 100%   Breastfeeding? Unknown   BMI 32.95 kg/m    LABS:               Recent Labs  10/15/16 0230 10/16/16 0532  WBC 5.8 11.5*  HGB 13.3 9.6*  PLT 195 162               Bloodtype: --/--/O POS, O POS (10/02 0230)  Rubella: Immune             tdap 2018                           Physical Exam:             Alert and Oriented X3  Lungs: Clear and unlabored  Heart: regular rate and rhythm / no mumurs  Abdomen: soft, non-tender, non-distended, active BS             Fundus: firm, non-tender, U-1             Dressing intact              Incision:  approximated with suture / no erythema / no ecchymosis / no drainage  Perineum: intact  Lochia: light  Extremities: no edema, no calf pain or tenderness  A:        POD # 2 S/P CS            ABL anemia  P:        Routine postoperative care              Offer flu vaccine prior to DC             Anticipate DC tomorrow    Marlinda Mike CNM, MSN, [facility] 10/17/2016, 9:47 AM

## 2016-10-18 MED ORDER — IBUPROFEN 600 MG PO TABS: 600.0000 mg | ORAL_TABLET | Freq: Four times a day (QID) | ORAL | 0 refills | Status: DC

## 2016-10-18 MED ORDER — POLYSACCHARIDE IRON COMPLEX 150 MG PO CAPS: 150.0000 mg | ORAL_CAPSULE | Freq: Every day | ORAL | 1 refills | Status: DC

## 2016-10-18 MED ORDER — MAGNESIUM OXIDE 400 (241.3 MG) MG PO TABS: 400.0000 mg | ORAL_TABLET | Freq: Every day | ORAL | 1 refills | Status: DC

## 2016-10-18 MED ORDER — OXYCODONE-ACETAMINOPHEN 5-325 MG PO TABS: 1.0000 | ORAL_TABLET | ORAL | 0 refills | Status: DC | PRN

## 2016-10-18 NOTE — Progress Notes (Signed)
Prescription given, pharmacy confirmed with pt.  Pt denies ques/concerns.  D/c home in stable condition.

## 2016-10-18 NOTE — Discharge Summary (Signed)
OB Discharge Summary  Patient Name: Marissa Cross DOB: 1990/02/13 MRN: [id number]  Date of admission: 10/14/2016 Delivering MD: MODY, VAISHALI   Date of discharge: 10/18/2016  Admitting diagnosis: 40WKS CTX Intrauterine pregnancy: [redacted]w[redacted]d     Secondary diagnosis:Principal Problem:   Postpartum care following cesarean delivery (10/2) Active Problems:   Encounter for induction of labor   Placental abruption affecting delivery   Placental abruption, delivered   Cesarean delivery delivered: Indication: placental abruption  Additional problems:none     Discharge diagnosis: Term Pregnancy Delivered                                                                     Post partum procedures:none  Augmentation: per labor notes  Complications: Placental Abruption  Hospital course:  Induction of Labor With Cesarean Section  26 y.o. yo G2P2002 at [redacted]w[redacted]d was admitted to the hospital 10/14/2016 for induction of labor. Patient had a labor course significant for fetal decels in MAU when presenting in early labor. Admitted for IOL, fetal bradycardia ensued. The patient went for cesarean section due to Non-Reassuring FHR, and delivered a Viable infant,@BABYSUPPRESS (DBLINK,ept,110,,1,,) Membrane Rupture Time/Date: )2:00 PM ,10/15/2016    of operation can be found in separate operative Note.  Patient had an uncomplicated postpartum course. She is ambulating, tolerating a regular diet, passing flatus, and urinating well.  Patient is discharged home in stable condition on 10/18/16.                                    Physical exam  Vitals:   10/16/16 1530 10/16/16 1920 10/17/16 1857 10/18/16 0630  BP: (!) 108/44 99/81 (!) 93/51 120/65  Pulse: 62 95 99 88  Resp: Temp: 98.4 F (36.9 C) 98.8 F (37.1 C) 98 F (36.7 C) 98.3 F (36.8 C)  TempSrc: Oral Oral Oral Oral  SpO2: 100%  97% 100%  Weight:      Height:       General: alert Lochia: appropriate Uterine Fundus:  firm Incision: Healing well with no significant drainage DVT Evaluation: No evidence of DVT seen on physical exam. Labs: Lab Results  Component Value Date   WBC 11.5 (H) 10/16/2016   HGB 9.6 (L) 10/16/2016   HCT 26.6 (L) 10/16/2016   MCV 87.5 10/16/2016   PLT 162 10/16/2016   CMP Latest Ref Rng & Units 02/21/2016  Glucose 65 - 99 mg/dL 92  BUN 7 - 25 mg/dL 7  Creatinine 4.09 - 8.11 mg/dL 9.14  Sodium 782 - 956 mmol/L 135  Potassium 3.5 - 5.3 mmol/L 3.9  Chloride 98 - 110 mmol/L 103  CO2 20 - 31 mmol/L 22  Calcium 8.6 - 10.2 mg/dL 9.4  Total Protein 6.1 - 8.1 g/dL 6.9  Total Bilirubin 0.2 - 1.2 mg/dL 0.6  Alkaline Phos 33 - 115 U/L 69  AST 10 - 30 U/L 14  ALT 6 - 29 U/L 12    Discharge instruction: per After Visit Summary and "Baby and Me Booklet".  After Visit Meds:  Allergies as of 10/18/2016   No Known Allergies     Medication List    TAKE  these medications   acetaminophen 500 MG tablet Commonly known as:  TYLENOL Take 500 mg by mouth every 6 (six) hours as needed for mild pain or headache.   ibuprofen 600 MG tablet Commonly known as:  ADVIL,MOTRIN Take 1 tablet (600 mg total) by mouth every 6 (six) hours.   iron polysaccharides 150 MG capsule Commonly known as:  NIFEREX Take 1 capsule (150 mg total) by mouth daily.   magnesium oxide 400 (241.3 Mg) MG tablet Commonly known as:  MAG-OX Take 1 tablet (400 mg total) by mouth daily.   oxyCODONE-acetaminophen 5-325 MG tablet Commonly known as:  ROXICET Take 1-2 tablets by mouth every 4 (four) hours as needed for severe pain.   prenatal multivitamin Tabs tablet Take 1 tablet by mouth daily at 12 noon.       Diet: routine diet  Activity: Advance as tolerated. Pelvic rest for 6 weeks.   Outpatient follow up:6 weeks Follow up Appt:No future appointments. Follow up visit: No Follow-up on file.  Postpartum contraception: unsure, IUD encouraged  Newborn Data: Live born female  Birth Weight: 7 lb 1.1 oz  (3205 g) APGAR: 8, 9  Newborn Delivery   Birth date/time:  10/15/2016 20:11:00 Delivery type:  C-Section, Low Transverse  C-section categorization:  Primary     Baby Feeding: Breast Disposition:home with mother   10/18/2016 Lendon Colonel., MD

## 2016-10-18 NOTE — Lactation Note (Signed)
This note was copied from a baby's chart. Lactation Consultation Note  Patient Name: Marissa Cross RUEAV'W Date: 10/18/2016 Reason for consult: Initial assessment  With this experienced breast feeding mom and term baby, now 79 hours old and at 5% weight loss. Mom breast fed her 27 year old for over a year, and states this baby is Bf well. Mom given lactation information, and denies any questions at this time.   Maternal Data    Feeding Feeding Type: Breast Fed Length of feed: 15 min  LATCH Score                   Interventions    Lactation Tools Discussed/Used     Consult Status Consult Status: Complete    Alfred Levins 10/18/2016, 9:36 AM

## 2016-10-18 NOTE — Progress Notes (Signed)
Marissa Cross, CSW called to notify of EDS score of 11 & pt due to be d/c.  CSW states that she can come see pt if pt agreeable to holding d/c until consulted.   Pt does not have a history of depression. Discussed results with pt & pt states that "it's just today that I feel like this" & states that this is not a normal feeling with for her; she believes she will be better once she is home.  Pt denies feeling like she wants to harm herself/infant.  PP depression discussed & pt educated on s/s.  Pt encouraged to inform husband and/or doctor if feelings progress to feelings of harming herself/infant or linger past 2 weeks.  Pt verb understanding & declines to see CSW prior to d/c.

## 2016-10-20 ENCOUNTER — Encounter (HOSPITAL_COMMUNITY): Payer: Self-pay | Admitting: Emergency Medicine

## 2016-10-20 ENCOUNTER — Emergency Department (HOSPITAL_COMMUNITY)
Admission: EM | Admit: 2016-10-20 | Discharge: 2016-10-20 | Disposition: A | Discharge status: Home / Self Care | Payer: Managed Care, Other (non HMO) | Attending: Emergency Medicine | Admitting: Emergency Medicine

## 2016-10-20 DIAGNOSIS — F438 Other reactions to severe stress: Secondary | ICD-10-CM | POA: Insufficient documentation

## 2016-10-20 DIAGNOSIS — Z79899 Other long term (current) drug therapy: Secondary | ICD-10-CM | POA: Diagnosis not present

## 2016-10-20 DIAGNOSIS — L0100 Impetigo, unspecified: Secondary | ICD-10-CM | POA: Diagnosis not present

## 2016-10-20 DIAGNOSIS — R21 Rash and other nonspecific skin eruption: Secondary | ICD-10-CM | POA: Diagnosis present

## 2016-10-20 MED ORDER — MUPIROCIN 2 % EX OINT
TOPICAL_OINTMENT | Freq: Once | CUTANEOUS | Status: AC
  Administered 2016-10-20: 1 via TOPICAL
  Filled 2016-10-20: qty 22

## 2016-10-20 NOTE — BH Assessment (Signed)
[facility] Assessment Progress Note  Went to room to talk to pt and RN states that pt does not want to talk to anyone and TTS consult will be cancelled.

## 2016-10-20 NOTE — ED Notes (Signed)
Bed: WTR6 Expected date:  Expected time:  Means of arrival:  Comments: 

## 2016-10-20 NOTE — Discharge Instructions (Signed)
The rash appears to be a bacterial infection called Impetigo. Apply the mupirocin ointment 3 times a day for 5 days. Follow-up with your primary care provider or OB/GYN on this matter. Return to the ED as needed for worsening symptoms.   Please follow up with your OBGYN this week if you are feeling down or depressed. Please return to the ED either here or at [facility] as soon as possible should your feelings of depression or anxiety worsen or you begin to have feelings of harming herself, your baby, or anyone else.

## 2016-10-20 NOTE — ED Provider Notes (Signed)
[facility]-EMERGENCY DEPT Provider Note   CSN: [id number] Arrival date & time: 10/20/16  1008     History   Chief Complaint Chief Complaint  Patient presents with  . Oral Swelling  . Rash    HPI Marissa Cross is a 26 y.o. female.  HPI   Marissa Cross is a 26 y.o. female, with a history of 5 days s/p c-section delivery, presenting to the ED with a perioral rash beginning about 5 days ago. Worsening since onset. Painful to the touch. Endorses some redness and drainage from lesions. Denies fever/chills, N/V/D, intraoral swelling/lesions.  Also endorses some feelings of feeling overwhelmed with the new baby. States she has a good support system at home with her partner and her mother. She denies thought of self harm, harming or neglecting her child, or harming someone else. Denies A/V hallucinations. She states is getting out of bed, feeding herself and her children, keeping up with hygiene, and able to perform all necessary activities of daily living.  PCP: [facility]  Past Medical History:  Diagnosis Date  . Placental abruption affecting delivery 10/15/2016  . Vaginal Pap smear, abnormal     Patient Active Problem List   Diagnosis Date Noted  . Encounter for induction of labor 10/15/2016  . Placental abruption affecting delivery 10/15/2016  . Placental abruption, delivered 10/15/2016  . Cesarean delivery delivered: Indication: placental abruption 10/15/2016  . Postpartum care following cesarean delivery (10/2) 10/15/2016  . CIN I (cervical intraepithelial neoplasia I) 11/21/2014  . 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 CESAREAN SECTION N/A 10/15/2016   Procedure: CESAREAN SECTION;  Surgeon: Shea Evans, MD;  Location: [facility] BIRTHING SUITES;  Service: Obstetrics;  Laterality: N/A;  . NO PAST SURGERIES      OB  History    Gravida Para Term Preterm AB Living   SAB TAB Ectopic Multiple Live Births         0 1       Home Medications    Prior to Admission medications   Medication Sig Start Date End Date Taking? Authorizing Provider  acetaminophen (TYLENOL) 500 MG tablet Take 500 mg by mouth every 6 (six) hours as needed for mild pain or headache.     [provider]  ibuprofen (ADVIL,MOTRIN) 600 MG tablet Take 1 tablet (600 mg total) by mouth every 6 (six) hours. 10/18/16   Noland Fordyce, MD  iron polysaccharides (NIFEREX) 150 MG capsule Take 1 capsule (150 mg total) by mouth daily. 10/18/16   Noland Fordyce, MD  magnesium oxide (MAG-OX) 400 (241.3 Mg) MG tablet Take 1 tablet (400 mg total) by mouth daily. 10/18/16   Noland Fordyce, MD  oxyCODONE-acetaminophen (ROXICET) 5-325 MG tablet Take 1-2 tablets by mouth every 4 (four) hours as needed for severe pain. 10/18/16   Noland Fordyce, MD  Prenatal Vit-Fe Fumarate-FA (PRENATAL MULTIVITAMIN) TABS tablet Take 1 tablet by mouth daily at 12 noon.    [provider]    Family History History reviewed. No pertinent family history.  Social History Social History  Substance Use Topics  . Smoking status: Never Smoker  . Smokeless tobacco: Never Used  . Alcohol use Yes     Comment: holidays only; not while preg     Allergies   Patient has no known allergies.  Review of Systems Review of Systems  Constitutional: Negative for chills and fever.  HENT: Negative for ear pain, sore throat and trouble swallowing.   Respiratory: Negative for shortness of breath.   Cardiovascular: Negative for chest pain.  Gastrointestinal: Negative for diarrhea, nausea and vomiting.  Skin: Positive for rash.  Neurological: Negative for headaches.  Psychiatric/Behavioral: Negative for hallucinations, self-injury and suicidal ideas.       Feeling overwhelmed  All other systems reviewed and are negative.    Physical Exam Updated  Vital Signs BP 107/80   Pulse 75   Temp 98.1 F (36.7 C) (Oral)   Resp 16   LMP  (LMP Unknown)   SpO2 99%   Physical Exam  Constitutional: She appears well-developed and well-nourished. No distress.  HENT:  Head: Normocephalic and atraumatic.  No intraoral lesions. No lesions within the ear canals. No periorbital lesions.    Eyes: Conjunctivae are normal.  Neck: Normal range of motion. Neck supple.  Cardiovascular: Normal rate, regular rhythm, normal heart sounds and intact distal pulses.   Pulmonary/Chest: Effort normal and breath sounds normal. No respiratory distress.  Abdominal: Soft. There is no guarding.  Patient is 5 days postop from c-section. She declined exam of her abdomen and incision site.   Musculoskeletal: She exhibits no edema.  Lymphadenopathy:    She has no cervical adenopathy.  Neurological: She is alert.  Skin: Skin is warm and dry. Rash noted. She is not diaphoretic.  Circumoral honey crusted lesions with erythematous base, tender to the touch. No noted herpetic lesions.   Psychiatric: Her behavior is normal.  At first, patient was a bit tearful and presented with a subdued mood. She was given encouragement and I listened to her speak of feeling overwhelmed at times. She made consistent eye contact and was engaged in the conversation. Her mood seemed to improve. She denied feelings or thoughts of self harm/suicide as well as thoughts of harming her child.  Nursing note and vitals reviewed.                ED Treatments / Results  Labs (all labs ordered are listed, but only abnormal results are displayed) Labs Reviewed - No data to display  EKG  EKG Interpretation None       Radiology No results found.  Procedures Procedures (including critical care time)  Medications Ordered in ED Medications  mupirocin ointment (BACTROBAN) 2 % (1 application Topical Given 10/20/16 1159)     Initial Impression / Assessment and Plan / ED Course    I have reviewed the triage vital signs and the nursing notes.  Pertinent labs & imaging results that were available during my care of the patient were reviewed by me and considered in my medical decision making (see chart for details).     Patient presents with circumoral rash. Exam findings consistent with impetigo.  Patient is concerned for the oral medication she is taking and their interactions with breast-feeding. Patient states she would like to try a topical ointment if it works just as well. Denies history of MRSA.  Contracts for safety. At first agrees to TTS consult, but then states she does not want to stay. RN, Florentina Addison, present for discussion. States she will follow up with her OBGYN as soon as possible. Message sent to Dr. Cherly Hensen for their office to possibly follow up with patient. Outpatient resources provided to patient as well.   The patient was given instructions for home care as well as return precautions.  Patient voices understanding of these instructions, accepts the plan, and is comfortable with discharge.  Findings and plan of care discussed with Rolan Bucco, MD.   Final Clinical Impressions(s) / ED Diagnoses   Final diagnoses:  Impetigo    New Prescriptions Discharge Medication List as of 10/20/2016 12:13 PM       Anselm Pancoast, PA-C 10/21/16 1543    Rolan Bucco, MD 10/21/16 1552

## 2016-10-20 NOTE — ED Triage Notes (Signed)
Pt reports she has had upper and lower lip swelling and rash for the past 5 days. [facility]. Denies throat swelling.

## 2016-10-21 ENCOUNTER — Telehealth: Payer: Self-pay | Admitting: Medical

## 2016-10-21 NOTE — Telephone Encounter (Signed)
Left message for pt to call. She needs a hospital follow up appt per hospital. No TOC phone call needs to be made.

## 2017-01-15 ENCOUNTER — Ambulatory Visit: Payer: Managed Care, Other (non HMO) | Admitting: Obstetrics and Gynecology

## 2017-01-22 ENCOUNTER — Encounter: Payer: Self-pay | Admitting: *Deleted

## 2017-02-03 ENCOUNTER — Encounter: Payer: Self-pay | Admitting: Obstetrics and Gynecology

## 2017-02-03 ENCOUNTER — Ambulatory Visit: Payer: Medicaid Other | Admitting: Obstetrics and Gynecology

## 2017-02-03 ENCOUNTER — Other Ambulatory Visit (HOSPITAL_COMMUNITY)
Admission: RE | Admit: 2017-02-03 | Discharge: 2017-02-03 | Disposition: A | Discharge status: Home / Self Care | Payer: Medicaid Other | Source: Ambulatory Visit | Attending: Obstetrics and Gynecology | Admitting: Obstetrics and Gynecology

## 2017-02-03 VITALS — BP 100/68 | HR 76 | Ht 63.0 in | Wt 173.1 lb

## 2017-02-03 DIAGNOSIS — N898 Other specified noninflammatory disorders of vagina: Secondary | ICD-10-CM | POA: Insufficient documentation

## 2017-02-03 DIAGNOSIS — Z01419 Encounter for gynecological examination (general) (routine) without abnormal findings: Secondary | ICD-10-CM

## 2017-02-03 DIAGNOSIS — Z0000 Encounter for general adult medical examination without abnormal findings: Secondary | ICD-10-CM

## 2017-02-03 DIAGNOSIS — Z3009 Encounter for other general counseling and advice on contraception: Secondary | ICD-10-CM

## 2017-02-03 DIAGNOSIS — R87612 Low grade squamous intraepithelial lesion on cytologic smear of cervix (LGSIL): Secondary | ICD-10-CM

## 2017-02-03 NOTE — Patient Instructions (Signed)
Contraception Choices Contraception, also called birth control, refers to methods or devices that prevent pregnancy. Hormonal methods Contraceptive implant A contraceptive implant is a thin, plastic tube that contains a hormone. It is inserted into the upper part of the arm. It can remain in place for up to 3 years. Progestin-only injections Progestin-only injections are injections of progestin, a synthetic form of the hormone progesterone. They are given every 3 months by a health care provider. Birth control pills Birth control pills are pills that contain hormones that prevent pregnancy. They must be taken once a day, preferably at the same time each day. Birth control patch The birth control patch contains hormones that prevent pregnancy. It is placed on the skin and must be changed once a week for three weeks and removed on the fourth week. A prescription is needed to use this method of contraception. Vaginal ring A vaginal ring contains hormones that prevent pregnancy. It is placed in the vagina for three weeks and removed on the fourth week. After that, the process is repeated with a new ring. A prescription is needed to use this method of contraception. Emergency contraceptive Emergency contraceptives prevent pregnancy after unprotected sex. They come in pill form and can be taken up to 5 days after sex. They work best the sooner they are taken after having sex. Most emergency contraceptives are available without a prescription. This method should not be used as your only form of birth control. Barrier methods Female condom A female condom is a thin sheath that is worn over the penis during sex. Condoms keep sperm from going inside a woman's body. They can be used with a spermicide to increase their effectiveness. They should be disposed after a single use. Female condom A female condom is a soft, loose-fitting sheath that is put into the vagina before sex. The condom keeps sperm from going  inside a woman's body. They should be disposed after a single use. Diaphragm A diaphragm is a soft, dome-shaped barrier. It is inserted into the vagina before sex, along with a spermicide. The diaphragm blocks sperm from entering the uterus, and the spermicide kills sperm. A diaphragm should be left in the vagina for 6-8 hours after sex and removed within 24 hours. A diaphragm is prescribed and fitted by a health care provider. A diaphragm should be replaced every 1-2 years, after giving birth, after gaining more than 15 lb (6.8 kg), and after pelvic surgery. Cervical cap A cervical cap is a round, soft latex or plastic cup that fits over the cervix. It is inserted into the vagina before sex, along with spermicide. It blocks sperm from entering the uterus. The cap should be left in place for 6-8 hours after sex and removed within 48 hours. A cervical cap must be prescribed and fitted by a health care provider. It should be replaced every 2 years. Sponge A sponge is a soft, circular piece of polyurethane foam with spermicide on it. The sponge helps block sperm from entering the uterus, and the spermicide kills sperm. To use it, you make it wet and then insert it into the vagina. It should be inserted before sex, left in for at least 6 hours after sex, and removed and thrown away within 30 hours. Spermicides Spermicides are chemicals that kill or block sperm from entering the cervix and uterus. They can come as a cream, jelly, suppository, foam, or tablet. A spermicide should be inserted into the vagina with an applicator at least 10-15 minutes before   sex to allow time for it to work. The process must be repeated every time you have sex. Spermicides do not require a prescription. Intrauterine contraception Intrauterine device (IUD) An IUD is a T-shaped device that is put in a woman's uterus. There are two types:  Hormone IUD.This type contains progestin, a synthetic form of the hormone progesterone. This  type can stay in place for 3-5 years.  Copper IUD.This type is wrapped in copper wire. It can stay in place for 10 years.  Permanent methods of contraception Female tubal ligation In this method, a woman's fallopian tubes are sealed, tied, or blocked during surgery to prevent eggs from traveling to the uterus. Hysteroscopic sterilization In this method, a small, flexible insert is placed into each fallopian tube. The inserts cause scar tissue to form in the fallopian tubes and block them, so sperm cannot reach an egg. The procedure takes about 3 months to be effective. Another form of birth control must be used during those 3 months. Female sterilization This is a procedure to tie off the tubes that carry sperm (vasectomy). After the procedure, the man can still ejaculate fluid (semen). Natural planning methods Natural family planning In this method, a couple does not have sex on days when the woman could become pregnant. Calendar method This means keeping track of the length of each menstrual cycle, identifying the days when pregnancy can happen, and not having sex on those days. Ovulation method In this method, a couple avoids sex during ovulation. Symptothermal method This method involves not having sex during ovulation. The woman typically checks for ovulation by watching changes in her temperature and in the consistency of cervical mucus. Post-ovulation method In this method, a couple waits to have sex until after ovulation. Summary  Contraception, also called birth control, means methods or devices that prevent pregnancy.  Hormonal methods of contraception include implants, injections, pills, patches, vaginal rings, and emergency contraceptives.  Barrier methods of contraception can include female condoms, female condoms, diaphragms, cervical caps, sponges, and spermicides.  There are two types of IUDs (intrauterine devices). An IUD can be put in a woman's uterus to prevent pregnancy  for 3-5 years.  Permanent sterilization can be done through a procedure for males, females, or both.  Natural family planning methods involve not having sex on days when the woman could become pregnant. This information is not intended to replace advice given to you by your health care provider. Make sure you discuss any questions you have with your health care provider. Document Released: 12/31/2004 Document Revised: 02/03/2016 Document Reviewed: 02/03/2016 Elsevier Interactive Patient Education  2018 Elsevier Inc.  

## 2017-02-03 NOTE — Progress Notes (Signed)
Patient is in the office for new GYN. Pt was previously on BC pills but stopped at the beginning of this month, pt reports irregular bleeding. [facility]. Pt was referred for colpo after c-section delivery 10-15-16.

## 2017-02-03 NOTE — Addendum Note (Signed)
Addended by: Natale MilchSTALLING, BRITTANY D on: 02/03/2017 11:29 AM   Modules accepted: Orders

## 2017-02-03 NOTE — Progress Notes (Signed)
GYNECOLOGY ANNUAL PREVENTATIVE CARE ENCOUNTER NOTE  Subjective:   Marissa Cross is a 27 y.o. 152P2002 female here for a annual gynecologic exam. Current complaints: numbness along her pfannenstiel line, still having deep cramping, having pinching feeling in back where epidural was placed when she lays down or moves "wrong", feels this happen once per day. Patient is breastfeeding, she is having irregular periods. Is sexually active, using condoms. Was on birth control pills but stopped a couple of weeks ago bc she missed some.   Gynecologic History Patient's last menstrual period was 01/15/2017. Contraception: condoms Last Pap: LGSIL, suspicious for high grade 03/2016, reports history of colposcopy but denies h/o LEEP or other cervical procedure Last mammogram: n/a. Gardisil: has received one shot  Obstetric History OB History  Gravida Para Term Preterm AB Living  2 2 2     2   SAB TAB Ectopic Multiple Live Births        0 2    # Outcome Date GA Lbr Len/2nd Weight Sex Delivery Anes PTL Lv  2 Term 10/15/16 [id number]  7 lb 1.1 oz (3.205 kg) F CS-LTranv EPI  LIV  1 Term 08/26/10    Judie PetitM Vag-Spont   LIV      Past Medical History:  Diagnosis Date  . Placental abruption affecting delivery 10/15/2016  . Vaginal Pap smear, abnormal     Past Surgical History:  Procedure Laterality Date  . CESAREAN SECTION N/A 10/15/2016   Procedure: CESAREAN SECTION;  Surgeon: Shea EvansMody, Vaishali, MD;  [facility];  Service: Obstetrics;  Laterality: N/A;  . NO PAST SURGERIES      Current Outpatient Medications on File Prior to Visit  Medication Sig Dispense Refill  . Prenatal Vit-Fe Fumarate-FA (PRENATAL MULTIVITAMIN) TABS tablet Take 1 tablet by mouth daily at 12 noon.    Marland Kitchen. acetaminophen (TYLENOL) 500 MG tablet Take 500 mg by mouth every 6 (six) hours as needed for mild pain or headache.     . ibuprofen (ADVIL,MOTRIN) 600 MG tablet Take 1 tablet (600 mg total) by mouth every 6 (six) hours.  (Patient not taking: Reported on 02/03/2017) 30 tablet 0  . iron polysaccharides (NIFEREX) 150 MG capsule Take 1 capsule (150 mg total) by mouth daily. (Patient not taking: Reported on 02/03/2017) 30 capsule 1  . magnesium oxide (MAG-OX) 400 (241.3 Mg) MG tablet Take 1 tablet (400 mg total) by mouth daily. (Patient not taking: Reported on 02/03/2017) 30 tablet 1  . oxyCODONE-acetaminophen (ROXICET) 5-325 MG tablet Take 1-2 tablets by mouth every 4 (four) hours as needed for severe pain. (Patient not taking: Reported on 02/03/2017) 30 tablet 0   No current facility-administered medications on file prior to visit.     No Known Allergies  Social History   Socioeconomic History  . Marital status: Single    Spouse name: Not on file  . Number of children: Not on file  . Years of education: Not on file  . Highest education level: Not on file  Social Needs  . Financial resource strain: Not on file  . Food insecurity - worry: Not on file  . Food insecurity - inability: Not on file  . Transportation needs - medical: Not on file  . Transportation needs - non-medical: Not on file  Occupational History  . Not on file  Tobacco Use  . Smoking status: Never Smoker  . Smokeless tobacco: Never Used  Substance and Sexual Activity  . Alcohol use: Yes    Comment: holidays  only; not while preg  . Drug use: No  . Sexual activity: Yes    Birth control/protection: None    Comment: tonight @2000   Other Topics Concern  . Not on file  Social History Narrative  . Not on file   History reviewed. No pertinent family history.  Diet: very hungry all the time, has gained 15 pounds since delivery, drinks a lot of water, eats a lot at night when she is up with baby Exercise: not currently, plans to start  The following portions of the patient's history were reviewed and updated as appropriate: allergies, current medications, past family history, past medical history, past social history, past surgical history  and problem list.  Review of Systems Pertinent items are noted in HPI.   Objective:  BP 100/68   Pulse 76   Ht 5\' 3"  (1.6 m)   Wt 173 lb 1.6 oz (78.5 kg)   LMP 01/15/2017   Breastfeeding? Yes   BMI 30.66 kg/m  CONSTITUTIONAL: Well-developed, well-nourished female in no acute distress.  HENT:  Normocephalic, atraumatic, External right and left ear normal. Oropharynx is clear and moist EYES: Conjunctivae and EOM are normal. Pupils are equal, round, and reactive to light. No scleral icterus.  NECK: Normal range of motion, supple, no masses.  Normal thyroid.  SKIN: Skin is warm and dry. No rash noted. Not diaphoretic. No erythema. No pallor. NEUROLOGIC: Alert and oriented to person, place, and time. Normal reflexes, muscle tone coordination. No cranial nerve deficit noted. PSYCHIATRIC: Normal mood and affect. Normal behavior. Normal judgment and thought content. CARDIOVASCULAR: Normal heart rate noted, regular rhythm RESPIRATORY: Clear to auscultation bilaterally. Effort and breath sounds normal, no problems with respiration noted. BREASTS: Symmetric in size. No masses, skin changes, nipple drainage, or lymphadenopathy. ABDOMEN: Soft, normal bowel sounds, no distention noted.  No tenderness, rebound or guarding.  PELVIC: deferred MUSCULOSKELETAL: Normal range of motion. No tenderness.  No cyanosis, clubbing, or edema.  2+ distal pulses.  Assessment and Plan:   1. Annual physical exam - GC/CT - HIV antibody (with reflex) - RPR - Hepatitis C Antibody - Hepatitis B surface antigen  2. Papanicolaou smear of cervix with low grade squamous intraepithelial lesion (LGSIL) Return for colpo  3. Encounter for counseling regarding contraception Not good at taking pills, reviewed LARCs, she is considering nexplanon  Will follow up results of STI screen and manage accordingly. Encouraged improvement in diet and exercise.   Routine preventative health maintenance measures  emphasized. Please refer to After Visit Summary for other counseling recommendations.    Baldemar Lenis, M.D. Attending Obstetrician & Gynecologist, [facility] [employer], [facility]

## 2017-02-04 LAB — CERVICOVAGINAL ANCILLARY ONLY
BACTERIAL VAGINITIS: POSITIVE — AB
Candida vaginitis: NEGATIVE
Chlamydia: NEGATIVE
Neisseria Gonorrhea: NEGATIVE
TRICH (WINDOWPATH): NEGATIVE

## 2017-02-04 LAB — RPR: RPR: NONREACTIVE

## 2017-02-04 LAB — HEPATITIS B SURFACE ANTIGEN: HEP B S AG: NEGATIVE

## 2017-02-04 LAB — HEPATITIS C ANTIBODY: Hep C Virus Ab: <0.1 s/co ratio (ref 0.0–0.9)

## 2017-02-04 LAB — HIV ANTIBODY (ROUTINE TESTING W REFLEX): HIV Screen 4th Generation wRfx: NONREACTIVE

## 2017-02-06 ENCOUNTER — Other Ambulatory Visit: Payer: Self-pay | Admitting: Obstetrics and Gynecology

## 2017-02-06 MED ORDER — METRONIDAZOLE 500 MG PO TABS: 500.0000 mg | ORAL_TABLET | Freq: Two times a day (BID) | ORAL | 0 refills | Status: DC

## 2017-02-06 NOTE — Progress Notes (Signed)
Script for flagyl sent to pharmacy 

## 2017-02-10 ENCOUNTER — Telehealth: Payer: Self-pay | Admitting: General Practice

## 2017-02-10 NOTE — Telephone Encounter (Signed)
Patient called and left message on nurse line stating Dr Earlene Plateravis saw her on 1/21 and called in Flagyl via mychart. Patient states she isn't sure what pharmacy it is at. Called patient, no answer- left message stating we are trying to reach you to return your phone call. Your prescription was sent to [facility] on [address]. If you have other questions please call us back

## 2017-02-21 ENCOUNTER — Emergency Department (HOSPITAL_COMMUNITY): Payer: Medicaid Other

## 2017-02-21 ENCOUNTER — Encounter (HOSPITAL_COMMUNITY): Payer: Self-pay | Admitting: Radiology

## 2017-02-21 ENCOUNTER — Other Ambulatory Visit: Payer: Self-pay

## 2017-02-21 ENCOUNTER — Emergency Department (HOSPITAL_COMMUNITY)
Admission: EM | Admit: 2017-02-21 | Discharge: 2017-02-22 | Disposition: A | Discharge status: Home / Self Care | Payer: Medicaid Other | Attending: Emergency Medicine | Admitting: Emergency Medicine

## 2017-02-21 DIAGNOSIS — R1030 Lower abdominal pain, unspecified: Secondary | ICD-10-CM | POA: Insufficient documentation

## 2017-02-21 DIAGNOSIS — R55 Syncope and collapse: Secondary | ICD-10-CM | POA: Diagnosis not present

## 2017-02-21 DIAGNOSIS — R110 Nausea: Secondary | ICD-10-CM | POA: Diagnosis not present

## 2017-02-21 LAB — COMPREHENSIVE METABOLIC PANEL
ALK PHOS: 109 U/L (ref 38–126)
ALT: 17 U/L (ref 14–54)
AST: 21 U/L (ref 15–41)
Albumin: 4.5 g/dL (ref 3.5–5.0)
Anion gap: 14 (ref 5–15)
BILIRUBIN TOTAL: 0.6 mg/dL (ref 0.3–1.2)
BUN: 11 mg/dL (ref 6–20)
CALCIUM: 9.6 mg/dL (ref 8.9–10.3)
CO2: 19 mmol/L — ABNORMAL LOW (ref 22–32)
CREATININE: 0.66 mg/dL (ref 0.44–1.00)
Chloride: 104 mmol/L (ref 101–111)
Glucose, Bld: 87 mg/dL (ref 65–99)
Potassium: 3.8 mmol/L (ref 3.5–5.1)
Sodium: 137 mmol/L (ref 135–145)
TOTAL PROTEIN: 7.4 g/dL (ref 6.5–8.1)

## 2017-02-21 LAB — CBC
HCT: 43 % (ref 36.0–46.0)
Hemoglobin: 15.1 g/dL — ABNORMAL HIGH (ref 12.0–15.0)
MCH: 30 pg (ref 26.0–34.0)
MCHC: 35.1 g/dL (ref 30.0–36.0)
MCV: 85.3 fL (ref 78.0–100.0)
PLATELETS: 264 10*3/uL (ref 150–400)
RBC: 5.04 MIL/uL (ref 3.87–5.11)
RDW: 13.3 % (ref 11.5–15.5)
WBC: 4.3 10*3/uL (ref 4.0–10.5)

## 2017-02-21 LAB — URINALYSIS, ROUTINE W REFLEX MICROSCOPIC
Bacteria, UA: NONE SEEN
Bilirubin Urine: NEGATIVE
GLUCOSE, UA: NEGATIVE mg/dL
Ketones, ur: NEGATIVE mg/dL
Leukocytes, UA: NEGATIVE
Nitrite: NEGATIVE
PROTEIN: NEGATIVE mg/dL
Specific Gravity, Urine: 1.021 (ref 1.005–1.030)
pH: 6 (ref 5.0–8.0)

## 2017-02-21 LAB — CBG MONITORING, ED: Glucose-Capillary: 84 mg/dL (ref 65–99)

## 2017-02-21 LAB — LIPASE, BLOOD: Lipase: 32 U/L (ref 11–51)

## 2017-02-21 LAB — I-STAT BETA HCG BLOOD, ED (MC, WL, AP ONLY): I-stat hCG, quantitative: <5 m[IU]/mL (ref <5)

## 2017-02-21 MED ORDER — SODIUM CHLORIDE 0.9 % IV BOLUS (SEPSIS)
1000.0000 mL | Freq: Once | INTRAVENOUS | Status: AC
  Administered 2017-02-21: 1000 mL via INTRAVENOUS

## 2017-02-21 MED ORDER — ONDANSETRON HCL 4 MG/2ML IJ SOLN
4.0000 mg | Freq: Once | INTRAMUSCULAR | Status: AC
Start: 2017-02-21 — End: 2017-02-21
  Administered 2017-02-21: 4 mg via INTRAVENOUS
  Filled 2017-02-21: qty 2

## 2017-02-21 MED ORDER — MORPHINE SULFATE (PF) 4 MG/ML IV SOLN
4.0000 mg | Freq: Once | INTRAVENOUS | Status: AC
  Administered 2017-02-21: 4 mg via INTRAVENOUS
  Filled 2017-02-21: qty 1

## 2017-02-21 MED ORDER — IOPAMIDOL (ISOVUE-300) INJECTION 61%
INTRAVENOUS | Status: AC
  Administered 2017-02-21: 100 mL
  Filled 2017-02-21: qty 100

## 2017-02-21 NOTE — ED Triage Notes (Addendum)
Per EMS pt was out to dinner and had syncopal episode. Fell to ground with no injury per pt. Pt had C section in october and [facility] but states she has not taken them as directed or consistently. 10/10 abdominal pain LLQ.  Pt has been sexually active since C section but has not used birth control.  Having vaginal bleeding.

## 2017-02-22 NOTE — Discharge Instructions (Signed)
Evaluation today is reassuring, abdominal CT shows no acute findings to be causing your pain, lab work is also reassuring, there is no elevated white count to suggest infection, and all other labs look good, no evidence of UTI.  Head CT shows no evidence of injury from your fall.  Your passing out was likely a response to pain.  Please follow-up with your OB/GYN as well as your primary care doctor as we discussed.  If you have worsening abdominal pain, persistent nausea or vomiting, fevers or chills or other new or concerning symptoms please return to the emergency department for reevaluation.

## 2017-02-22 NOTE — ED Provider Notes (Signed)
[facility] [address] EMERGENCY DEPARTMENT Provider Note   CSN: [id number] Arrival date & time: 02/21/17  2005     History   Chief Complaint Chief Complaint  Patient presents with  . Loss of Consciousness  . Abdominal Pain    HPI  Marissa Cross is a 27 y.o. Female who is otherwise healthy, currently breast-feeding, presents to the ED via EMS for evaluation of syncopal episode and lower abdominal pain.  Patient describes lower abdominal pain as sharp and constant, in triage it was noted to be left lower quadrant but here patient reports right lower quadrant abdominal pain.  Patient reports she has had intermittent pain for the past few days, but this afternoon the pain became sharp, constant and worsening. Pt reports associated nausea and decreased appetite, but no vomiting, or diarrhea, no fevers or chills, no urinary symptoms or vaginal discharge. Pt reports she was at a restaurant with family and was experiencing worsening abdominal pain and nausea, she tried to get and and walk it off and go get some food, while walking she began to feel light headed and then passed out, pt reports she hit the back of her head, denies any bleeding or obvious injury, does report headache. Pt denies any CP or shortness of breath preceding or following the episode, no hx of syncope or cardiac conditions.  Patient does report she has been following up with her OB/GYN recently due to some spotting since her C-section in October, patient reports she is also been starting and stopping different birth controls which may also be contributing to the spotting, no large volume vaginal bleeding, pelvic pain or discharge associated with this.  Patient just recently had a pelvic exam and full STD testing on 1/21. Patient reports she was recently prescribed antibiotics by her OB/GYN but she is unsure which antibiotic why she was prescribed them, reports she has not been taking. Pt is in general a poor historian and it was  difficult to obtain this information.  On chart review it appears patient was treated with Flagyl, for BV, recently had abnormal Pap which she has follow-up coming up for, otherwise workup was benign.      Past Medical History:  Diagnosis Date  . Placental abruption affecting delivery 10/15/2016  . Vaginal Pap smear, abnormal     Patient Active Problem List   Diagnosis Date Noted  . Encounter for induction of labor 10/15/2016  . Placental abruption affecting delivery 10/15/2016  . Placental abruption, delivered 10/15/2016  . Cesarean delivery delivered: Indication: placental abruption 10/15/2016  . Postpartum care following cesarean delivery (10/2) 10/15/2016  . CIN I (cervical intraepithelial neoplasia I) 11/21/2014  . 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 CESAREAN SECTION N/A 10/15/2016   Procedure: CESAREAN SECTION;  Surgeon: Shea Evans, MD;  Location: [facility] BIRTHING SUITES;  Service: Obstetrics;  Laterality: N/A;  . NO PAST SURGERIES      OB History    Gravida Para Term Preterm AB Living   2 2 2     2    SAB TAB Ectopic Multiple Live Births         0 2       Home Medications    Prior to Admission medications   Medication Sig Start Date End Date Taking? Authorizing Provider  acetaminophen (TYLENOL) 500 MG tablet Take 500 mg by  mouth every 6 (six) hours as needed for mild pain or headache.     [provider]  ibuprofen (ADVIL,MOTRIN) 600 MG tablet Take 1 tablet (600 mg total) by mouth every 6 (six) hours. Patient not taking: Reported on 02/03/2017 10/18/16   Noland FordyceFogleman, Kelly, MD  iron polysaccharides (NIFEREX) 150 MG capsule Take 1 capsule (150 mg total) by mouth daily. Patient not taking: Reported on 02/03/2017 10/18/16   Noland FordyceFogleman, Kelly, MD  magnesium oxide (MAG-OX) 400 (241.3 Mg) MG tablet Take 1 tablet  (400 mg total) by mouth daily. Patient not taking: Reported on 02/03/2017 10/18/16   Noland FordyceFogleman, Kelly, MD  metroNIDAZOLE (FLAGYL) 500 MG tablet Take 1 tablet (500 mg total) by mouth 2 (two) times daily. 02/06/17   Conan Bowensavis, Kelly M, MD  oxyCODONE-acetaminophen (ROXICET) 5-325 MG tablet Take 1-2 tablets by mouth every 4 (four) hours as needed for severe pain. Patient not taking: Reported on 02/03/2017 10/18/16   Noland FordyceFogleman, Kelly, MD  Prenatal Vit-Fe Fumarate-FA (PRENATAL MULTIVITAMIN) TABS tablet Take 1 tablet by mouth daily at 12 noon.    [provider]    Family History No family history on file.  Social History Social History   Tobacco Use  . Smoking status: Never Smoker  . Smokeless tobacco: Never Used  Substance Use Topics  . Alcohol use: Yes    Comment: holidays only; not while preg  . Drug use: No     Allergies   Patient has no known allergies.   Review of Systems Review of Systems  Constitutional: Negative for chills and fever.  HENT: Negative for congestion, postnasal drip and sore throat.   Eyes: Negative for photophobia and visual disturbance.  Respiratory: Negative for cough, chest tightness and shortness of breath.   Cardiovascular: Negative for chest pain and leg swelling.  Gastrointestinal: Positive for abdominal pain and nausea. Negative for blood in stool, diarrhea and vomiting.  Genitourinary: Positive for vaginal bleeding. Negative for dysuria, flank pain, frequency, pelvic pain, vaginal discharge and vaginal pain.  Musculoskeletal: Negative for arthralgias and myalgias.  Skin: Negative for pallor, rash and wound.  Neurological: Positive for syncope and headaches. Negative for dizziness, weakness, light-headedness and numbness.     Physical Exam Updated Vital Signs BP 109/71   Pulse 69   Resp 19   SpO2 98%   Physical Exam  Constitutional: She appears well-developed and well-nourished. No distress.  HENT:  Head: Normocephalic and atraumatic.    Mouth/Throat: Oropharynx is clear and moist.  Tenderness over the posterior scalp, no appreciable deformity, step-off, hematoma or laceration, no CSF otorrhea or hemotympanum  Eyes: EOM are normal. Pupils are equal, round, and reactive to light. Right eye exhibits no discharge. Left eye exhibits no discharge.  Neck: Neck supple.  C-spine nontender to palpation at midline, normal range of motion of the neck  Cardiovascular: Normal rate, regular rhythm, normal heart sounds and intact distal pulses.  Pulses:      Radial pulses are 2+ on the right side, and 2+ on the left side.       Dorsalis pedis pulses are 2+ on the right side, and 2+ on the left side.  Pulmonary/Chest: Effort normal and breath sounds normal. No respiratory distress.  Respirations equal and unlabored, patient able to speak in full sentences, lungs clear to auscultation bilaterally  Abdominal: Soft. Bowel sounds are normal.  Abdomen is soft and nondistended, generalized abdominal tenderness, worse over the lower abdomen, with some guarding, no CVA tenderness, C-section scar appears to be well-healed,  no erythema, fluctuance or induration  Genitourinary:  Genitourinary Comments: Pelvic exam deffered by pt  Musculoskeletal: She exhibits no edema or deformity.  Neurological: She is alert. Coordination normal.  Speech is clear, able to follow commands CN III-XII intact Normal strength in upper and lower extremities bilaterally including dorsiflexion and plantar flexion, strong and equal grip strength Sensation normal to light and sharp touch Moves extremities without ataxia, coordination intact  Skin: Skin is warm and dry. Capillary refill takes less than 2 seconds. She is not diaphoretic.  Psychiatric: She has a normal mood and affect. Her behavior is normal.  Nursing note and vitals reviewed.    ED Treatments / Results  Labs (all labs ordered are listed, but only abnormal results are displayed) Labs Reviewed   COMPREHENSIVE METABOLIC PANEL - Abnormal; Notable for the following components:      Result Value   CO2 19 (*)    All other components within normal limits  CBC - Abnormal; Notable for the following components:   Hemoglobin 15.1 (*)    All other components within normal limits  URINALYSIS, ROUTINE W REFLEX MICROSCOPIC - Abnormal; Notable for the following components:   Color, Urine STRAW (*)    Hgb urine dipstick MODERATE (*)    Squamous Epithelial / LPF 0-5 (*)    All other components within normal limits  LIPASE, BLOOD  I-STAT BETA HCG BLOOD, ED ([facility], [facility], [facility] ONLY)  CBG MONITORING, ED    EKG ED ECG REPORT   Date: 02/21/2017  Rate: 64  Rhythm: normal sinus rhythm  QRS Axis: normal  Intervals: normal  ST/T Wave abnormalities: normal  Conduction Disutrbances:none  Narrative Interpretation: Normal sinus rhythm, normal EKG  Old EKG Reviewed: none available  I have personally reviewed the EKG tracing and agree with the computerized printout as noted.   Radiology Ct Head Wo Contrast  Result Date: 02/21/2017 CLINICAL DATA:  Syncopal episode with trauma to the head. Subsequent dizziness. EXAM: CT HEAD WITHOUT CONTRAST TECHNIQUE: Contiguous axial images were obtained from the base of the skull through the vertex without intravenous contrast. COMPARISON:  None. FINDINGS: Brain: The brain shows a normal appearance without evidence of malformation, atrophy, old or acute small or large vessel infarction, mass lesion, hemorrhage, hydrocephalus or extra-axial collection. Vascular: No hyperdense vessel. No evidence of atherosclerotic calcification. Skull: Normal.  No traumatic finding.  No focal bone lesion. Sinuses/Orbits: Sinuses are clear. Orbits appear normal. Mastoids are clear. Other: None significant IMPRESSION: Normal head CT Electronically Signed   By: Paulina Fusi M.D.   On: 02/21/2017 21:52   Ct Abdomen Pelvis W Contrast  Result Date: 02/21/2017 CLINICAL DATA:  Left lower quadrant  pain since C-section October 2018. Vaginal bleeding. EXAM: CT ABDOMEN AND PELVIS WITH CONTRAST TECHNIQUE: Multidetector CT imaging of the abdomen and pelvis was performed using the standard protocol following bolus administration of intravenous contrast. CONTRAST:  ISOVUE-300 IOPAMIDOL (ISOVUE-300) INJECTION 61% COMPARISON:  None. FINDINGS: Lower chest: No acute abnormality. Hepatobiliary: Probable tiny cyst posteriorly in the right hepatic lobe on series 3, image 19. The liver, portal vein, and gallbladder are otherwise normal. Pancreas: Unremarkable. No pancreatic ductal dilatation or surrounding inflammatory changes. Spleen: Probable small cyst in the spleen on series 3, image 18. Adrenals/Urinary Tract: Adrenal glands are unremarkable. Kidneys are normal, without renal calculi, focal lesion, or hydronephrosis. Bladder is unremarkable. Stomach/Bowel: Stomach is within normal limits. Appendix appears normal. No evidence of bowel wall thickening, distention, or inflammatory changes. Vascular/Lymphatic: No significant vascular findings  are present. No enlarged abdominal or pelvic lymph nodes. Reproductive: Uterus and bilateral adnexa are unremarkable. Other: No abdominal wall hernia or abnormality. No abdominopelvic ascites. Musculoskeletal: No acute or significant osseous findings. IMPRESSION: 1. No cause for the patient's left lower quadrant pain identified. No acute abnormalities. Electronically Signed   By: Gerome Sam III M.D   On: 02/21/2017 22:11    Procedures Procedures (including critical care time)  Medications Ordered in ED Medications  morphine 4 MG/ML injection 4 mg (4 mg Intravenous Given 02/21/17 2200)  ondansetron (ZOFRAN) injection 4 mg (4 mg Intravenous Given 02/21/17 2200)  sodium chloride 0.9 % bolus 1,000 mL (0 mLs Intravenous Stopped 02/21/17 2330)  iopamidol (ISOVUE-300) 61 % injection (100 mLs  Contrast Given 02/21/17 2148)     Initial Impression / Assessment and Plan / ED  Course  I have reviewed the triage vital signs and the nursing notes.  Pertinent labs & imaging results that were available during my care of the patient were reviewed by me and considered in my medical decision making (see chart for details).  Patient presents to the ED for evaluation of abdominal pain as well as a syncopal episode.  Patient was experiencing sharp constant lower abdominal pain, became dizzy and lightheaded and then passed out hitting the back of her head, no associated chest pain or shortness of breath, no history of syncope or cardiac conditions.  Syncope story suggestive of vasovagal syncope in response to pain.  On exam vitals are normal and patient is overall well-appearing, patient appears anxious and is a poor historian, has difficulty remembering things is unclear if this is patient's baseline or different since hitting her head will get CT scan of the head. Abdomen with generalized tenderness worse over the lower quadrants with some guarding, will get abdominal CT, IV fluids, morphine and Zofran for pain and nausea.  Labs collected in triage return and her overall reassuring, no leukocytosis and hemoglobin is normal in the setting of intermittent vaginal bleeding, no concern for severe blood loss.  Pregnancy test negative, UA without evidence of infection, no electrolyte derangements requiring intervention, kidney and liver function normal, lipase normal.  After pain medication and fluids patient is able to provide more information regarding the events, she is also more comfortable, denying any pain at this time.  Head CT shows no acute abnormalities, patient seems to be mentating more normally at this time.  Abdominal CT shows no acute abdominal or pelvic process to explain patient's pain, specifically appendix is normal, no noted colitis, uterus and bilateral adnexa are unremarkable.  Discussed these results at length with patient and family members.  At this time there does not  appear to be life limb or organ threatening condition, will have patient continue to follow-up with her primary care doctor as well as her OB/GYN she has appointment early next week.  Strict return precautions discussed with the patient, ample time was provided to answer questions, at this time patient is stable for discharge she expresses understanding and is in agreement with plan.  Final Clinical Impressions(s) / ED Diagnoses   Final diagnoses:  Lower abdominal pain  Syncope, unspecified syncope type  Nausea    ED Discharge Orders    None       Dartha Lodge, [place] 02/22/17 1342    Arby Barrette, MD 02/23/17 [phone]

## 2017-02-27 ENCOUNTER — Encounter: Payer: Self-pay | Admitting: *Deleted

## 2017-02-27 ENCOUNTER — Encounter: Payer: Self-pay | Admitting: Obstetrics and Gynecology

## 2017-02-27 ENCOUNTER — Ambulatory Visit (INDEPENDENT_AMBULATORY_CARE_PROVIDER_SITE_OTHER): Payer: Medicaid Other | Admitting: Obstetrics and Gynecology

## 2017-02-27 VITALS — BP 113/73 | HR 88 | Wt 178.5 lb

## 2017-02-27 DIAGNOSIS — R87612 Low grade squamous intraepithelial lesion on cytologic smear of cervix (LGSIL): Secondary | ICD-10-CM

## 2017-02-27 DIAGNOSIS — Z3202 Encounter for pregnancy test, result negative: Secondary | ICD-10-CM | POA: Diagnosis not present

## 2017-02-27 LAB — POCT URINE PREGNANCY: PREG TEST UR: NEGATIVE

## 2017-02-27 NOTE — Progress Notes (Signed)
Colposcopy Procedure Note  Pre-operative Diagnosis: low grade  Post-operative Diagnosis: same  Indications:  Pap 04/2016: LGSIL, suspicious for high grade during pregnancy  Procedure Details  LMP 02/13/17; UPT negative    The risks (including infection, bleeding, pain) and benefits of the procedure were explained to the patient and written informed consent was obtained.  The patient was placed in the dorsal lithotomy position. A graves was speculum inserted in the vagina, and the cervix was visualized.  The cervix was stained with acetic acid and visualized using the colposcope under magnification as well as with a green filter. Findings as below. Cervical biopsies were taken at 6 o'clock-1 cm removed from os, all of the following at the SCJ: 7 o'clock, 10 o'clock, 2 o'clock. Endocervical curettage then performed in all four quadrants. Small amount of bleeding noted that improved with pressure. Silver nitrate and Monsel's solution applied with good hemostasis noted. Patient tolerating procedure very well.  Findings: SCJ seen, some acetowhite lesions noted at 2 o'clock, 10 o'clock, abrnomal vasculature at 7 o'clock and persistent vascularity in lower half of cervix removed from os  Adequate: yes  Specimens: 6 o'clock-1 cm removed from os, all of the following at the SCJ: 7 o'clock, 10 o'clock, 2 o'clock, endocervical curretage  Condition: Stable  Complications: None  Plan: The patient was advised to call for any fever or for prolonged or severe pain or bleeding. She was advised to use OTC analgesics as needed for mild to moderate pain. She was advised to avoid vaginal intercourse for 48 hours or until the bleeding has completely stopped.   Baldemar LenisK. Meryl Davis, M.D. Attending Obstetrician & Gynecologist, [facility] for [facility], [facility]

## 2017-03-14 ENCOUNTER — Telehealth: Payer: Self-pay

## 2017-03-14 NOTE — Telephone Encounter (Signed)
Patient notified, she verbalized understand and agreement.

## 2017-03-14 NOTE — Telephone Encounter (Signed)
-----   Message from Conan BowensKelly M Davis, MD sent at 03/04/2017  1:17 PM EST ----- CIN1 on colpo. Needs repeat pap 1 year.

## 2017-05-08 ENCOUNTER — Ambulatory Visit: Payer: Medicaid Other | Admitting: Obstetrics and Gynecology

## 2017-06-26 ENCOUNTER — Ambulatory Visit (INDEPENDENT_AMBULATORY_CARE_PROVIDER_SITE_OTHER): Payer: Medicaid Other | Admitting: Obstetrics and Gynecology

## 2017-06-26 ENCOUNTER — Encounter: Payer: Self-pay | Admitting: Obstetrics and Gynecology

## 2017-06-26 DIAGNOSIS — Z3202 Encounter for pregnancy test, result negative: Secondary | ICD-10-CM

## 2017-06-26 LAB — POCT URINE PREGNANCY: PREG TEST UR: NEGATIVE

## 2017-06-26 NOTE — Progress Notes (Signed)
Patient not seen, had unprotected intercourse within the last week. Will return for nexplanon placement, patient counseled she must refrain from intercourse for 2 weeks.

## 2017-06-26 NOTE — Progress Notes (Signed)
Presents for Nexplanon Insertion. UPT today is NEGATIVE  Last time patient had sex was Saturday and Sunday.  She will be going on a "Fun weekend" in 2 weeks, so she will schedule her 2nd. UPT/Nexplanon Insert for the middle of July.  Patient was given condoms

## 2017-07-14 ENCOUNTER — Ambulatory Visit: Payer: Medicaid Other | Admitting: Obstetrics and Gynecology

## 2017-08-26 ENCOUNTER — Encounter: Payer: Self-pay | Admitting: Medical

## 2017-08-26 ENCOUNTER — Ambulatory Visit: Payer: Medicaid Other | Admitting: Medical

## 2017-08-26 VITALS — BP 110/70 | HR 110 | Temp 98.7°F | Ht 63.0 in | Wt 177.4 lb

## 2017-08-26 DIAGNOSIS — J988 Other specified respiratory disorders: Secondary | ICD-10-CM

## 2017-08-26 DIAGNOSIS — B001 Herpesviral vesicular dermatitis: Secondary | ICD-10-CM | POA: Diagnosis not present

## 2017-08-26 MED ORDER — VALACYCLOVIR HCL 1 G PO TABS: ORAL_TABLET | ORAL | 0 refills | Status: DC

## 2017-08-26 MED ORDER — AMOXICILLIN 500 MG PO TABS: ORAL_TABLET | ORAL | 0 refills | Status: DC

## 2017-08-26 NOTE — Progress Notes (Signed)
Subjective: Chief Complaint  Patient presents with  . Sore Throat    started with cough, hurst to swallow, sneezing,blood in mucus    Feels awful.  Here with infant daughter and young son.    Has felt bad for 2 weeks, bad sore throat, congestion, mucous drainage, head congestion.   Lots of cough.    Getting up brown mucous.   Some bloody nasal mucous.   Has had fever.   No NVD.  Feels weak.  Has had body aches, chills.   No sick contacts at home.   Using some delsym, alka seltzer.  Nonsmoker.  No other aggravating or relieving factors. No other complaint.  Past Medical History:  Diagnosis Date  . Placental abruption affecting delivery 10/15/2016  . Vaginal Pap smear, abnormal    No current outpatient medications on file prior to visit.   No current facility-administered medications on file prior to visit.    ROS as in subjective   Objective: BP 110/70   Pulse (!) 110   Temp 98.7 F (37.1 C) (Oral)   Ht 5\' 3"  (1.6 m)   Wt 177 lb 6.4 oz (80.5 kg)   SpO2 97%   BMI 31.42 kg/m   General appearance: Alert, WD/WN, no distress, ill appearing                             Skin: warm, no rash, no diaphoresis                           Head: + sinus tenderness                            Eyes: conjunctiva normal, corneas clear, PERRLA                            Ears: flat TMs, external ear canals normal                          Nose: septum midline, turbinates swollen, with erythema and clear discharge             Mouth/throat: MMM, tongue normal, moderate pharyngeal erythema, right upper lip with small patch of vesicular lesions and erythema                           Neck: supple, no adenopathy, no thyromegaly, non tender                          Heart: RRR, normal S1, S2, no murmurs                         Lungs: +bronchial breath sounds, +scattered rhonchi, no wheezes, no rales                Extremities: no edema, non tender        Assessment: Encounter Diagnoses  Name Primary?   Marland Kitchen. Respiratory tract infection Yes  . Cold sore      Plan: Discussed symptoms, exam findings.  Begin medications below, can c/t OTC Alka Seltzer.   Rest, hydrate well.  Discussed treatment for cold sore, time frame for resolution, precautions and prevent of spread to others  F/u if worse or not seeing some improvment in the next 3-4 days  Carrell was seen today for sore throat.  Diagnoses and all orders for this visit:  Respiratory tract infection  Cold sore  Other orders -     amoxicillin (AMOXIL) 500 MG tablet; 2 tablets po BID x 10 days -     valACYclovir (VALTREX) 1000 MG tablet; 2 tablets BID x 1 day for flare up cold sore

## 2017-09-17 ENCOUNTER — Ambulatory Visit: Payer: Medicaid Other | Admitting: Obstetrics and Gynecology

## 2018-12-30 ENCOUNTER — Ambulatory Visit: Payer: Medicaid Other | Admitting: Obstetrics and Gynecology

## 2018-12-30 ENCOUNTER — Other Ambulatory Visit: Payer: Self-pay

## 2018-12-30 ENCOUNTER — Encounter: Payer: Self-pay | Admitting: Obstetrics and Gynecology

## 2018-12-30 VITALS — BP 107/69 | HR 86 | Temp 99.1°F | Resp 16 | Ht 63.0 in | Wt 178.2 lb

## 2018-12-30 DIAGNOSIS — Z3202 Encounter for pregnancy test, result negative: Secondary | ICD-10-CM

## 2018-12-30 DIAGNOSIS — Z01818 Encounter for other preprocedural examination: Secondary | ICD-10-CM

## 2018-12-30 LAB — POCT URINE PREGNANCY: Preg Test, Ur: NEGATIVE

## 2018-12-30 NOTE — Progress Notes (Signed)
Patient had unprotected intercourse last week, will be scheduled for Nexplanon insertion.   Advised to return 2 weeks, no unprotected intercourse in 2 weeks prior to insertion.   Feliz Beam, M.D. Attending Center for [employer])

## 2019-01-13 ENCOUNTER — Ambulatory Visit: Payer: Medicaid Other | Admitting: Obstetrics & Gynecology

## 2019-02-02 ENCOUNTER — Ambulatory Visit (INDEPENDENT_AMBULATORY_CARE_PROVIDER_SITE_OTHER): Payer: Medicaid Other | Admitting: Obstetrics and Gynecology

## 2019-02-02 ENCOUNTER — Other Ambulatory Visit: Payer: Self-pay

## 2019-02-02 ENCOUNTER — Other Ambulatory Visit (HOSPITAL_COMMUNITY)
Admission: RE | Admit: 2019-02-02 | Discharge: 2019-02-02 | Disposition: A | Discharge status: Home / Self Care | Payer: Medicaid Other | Source: Ambulatory Visit | Attending: Obstetrics and Gynecology | Admitting: Obstetrics and Gynecology

## 2019-02-02 ENCOUNTER — Encounter: Payer: Self-pay | Admitting: Obstetrics and Gynecology

## 2019-02-02 VITALS — BP 107/69 | HR 69 | Wt 178.0 lb

## 2019-02-02 DIAGNOSIS — Z0000 Encounter for general adult medical examination without abnormal findings: Secondary | ICD-10-CM | POA: Diagnosis not present

## 2019-02-02 DIAGNOSIS — Z01419 Encounter for gynecological examination (general) (routine) without abnormal findings: Secondary | ICD-10-CM | POA: Insufficient documentation

## 2019-02-02 DIAGNOSIS — Z30017 Encounter for initial prescription of implantable subdermal contraceptive: Secondary | ICD-10-CM

## 2019-02-02 DIAGNOSIS — Z3202 Encounter for pregnancy test, result negative: Secondary | ICD-10-CM

## 2019-02-02 LAB — POCT URINE PREGNANCY: Preg Test, Ur: NEGATIVE

## 2019-02-02 MED ORDER — ETONOGESTREL 68 MG ~~LOC~~ IMPL
68.0000 mg | DRUG_IMPLANT | Freq: Once | SUBCUTANEOUS | Status: AC
  Administered 2019-02-02: 15:00:00 68 mg via SUBCUTANEOUS

## 2019-02-02 NOTE — Progress Notes (Signed)
Subjective:     Marissa Cross is a 29 y.o. female P2 with BMI 31 and LMP 01/28/19 who is here for a comprehensive physical exam and Nexplanon insertion. The patient reports no problems. Patient is sexually active using condoms. She denies any pelvic pain or abnormal discharge. Patient is preparing to start acutane for cystic acne treatment  Past Medical History:  Diagnosis Date  . Placental abruption affecting delivery 10/15/2016  . Vaginal Pap smear, abnormal    Past Surgical History:  Procedure Laterality Date  . CESAREAN SECTION N/A 10/15/2016   Procedure: CESAREAN SECTION;  Surgeon: Azucena Fallen, MD;  Location: [place];  Service: Obstetrics;  Laterality: N/A;  . NO PAST SURGERIES     History reviewed. No pertinent family history.   Social History   Socioeconomic History  . Marital status: Single    Spouse name: Not on file  . Number of children: Not on file  . Years of education: Not on file  . Highest education level: Not on file  Occupational History  . Not on file  Tobacco Use  . Smoking status: Never Smoker  . Smokeless tobacco: Never Used  Substance and Sexual Activity  . Alcohol use: Yes    Comment: holidays only; not while preg  . Drug use: No  . Sexual activity: Yes    Birth control/protection: None  Other Topics Concern  . Not on file  Social History Narrative  . Not on file   Social Determinants of Health   Financial Resource Strain:   . Difficulty of Paying Living Expenses: Not on file  Food Insecurity:   . Worried About Charity fundraiser in the Last Year: Not on file  . Ran Out of Food in the Last Year: Not on file  Transportation Needs:   . Lack of Transportation (Medical): Not on file  . Lack of Transportation (Non-Medical): Not on file  Physical Activity:   . Days of Exercise per Week: Not on file  . Minutes of Exercise per Session: Not on file  Stress:   . Feeling of Stress : Not on file  Social Connections:   . Frequency of  Communication with Friends and Family: Not on file  . Frequency of Social Gatherings with Friends and Family: Not on file  . Attends Religious Services: Not on file  . Active Member of Clubs or Organizations: Not on file  . Attends Archivist Meetings: Not on file  . Marital Status: Not on file  Intimate Partner Violence:   . Fear of Current or Ex-Partner: Not on file  . Emotionally Abused: Not on file  . Physically Abused: Not on file  . Sexually Abused: Not on file   Health Maintenance  Topic Date Due  . TETANUS/TDAP  05/31/2008  . PAP-Cervical Cytology Screening  06/01/2010  . INFLUENZA VACCINE  08/15/2018  . PAP SMEAR-Modifier  03/15/2019  . HIV Screening  Completed       Review of Systems Pertinent items noted in HPI and remainder of comprehensive ROS otherwise negative.   Objective:  Blood pressure 107/69, pulse 69, weight 178 lb (80.7 kg), last menstrual period 01/28/2019.     GENERAL: Well-developed, well-nourished female in no acute distress.  HEENT: Normocephalic, atraumatic. Sclerae anicteric.  NECK: Supple. Normal thyroid.  LUNGS: Clear to auscultation bilaterally.  HEART: Regular rate and rhythm. BREASTS: Symmetric in size. No palpable masses or lymphadenopathy, skin changes, or nipple drainage. ABDOMEN: Soft, nontender, nondistended. No organomegaly. PELVIC: Normal  external female genitalia. Vagina is pink and rugated.  Normal discharge. Normal appearing cervix. Uterus is normal in size. No adnexal mass or tenderness. EXTREMITIES: No cyanosis, clubbing, or edema, 2+ distal pulses.    Assessment:    Healthy female exam.      Plan:    Pap smear collected Patient will be contacted with abnormal results Nexplanon insertion Patient given informed consent, signed copy in the chart, time out was performed. Pregnancy test was negative. Appropriate time out taken.  Patient's left arm was prepped and draped in the usual sterile fashion.. The ruler used  to measure and mark insertion area.  Patient was prepped with alcohol swab and then injected with 2 cc of 1% lidocaine with epinephrine.  Patient was prepped with betadine, Nexplanon removed form packaging.  Device confirmed in needle, then inserted full length of needle and withdrawn per handbook instructions.  Patient insertion site covered with a band aid and coband.   Minimal blood loss.  Patient tolerated the procedure well.     See After Visit Summary for Counseling Recommendations

## 2019-02-02 NOTE — Progress Notes (Signed)
Patient presents for Annual Exam today.  LMP:01/28/19 Last pap: unsure  [facility]  STD Screening: Declined  Contraception: None   CC: None  Pt denies any unprotected intercourse x 14 days   UPT : NEGATIVE

## 2019-02-03 LAB — CERVICOVAGINAL ANCILLARY ONLY
Chlamydia: NEGATIVE
Comment: NEGATIVE
Comment: NORMAL
Neisseria Gonorrhea: NEGATIVE

## 2019-02-03 LAB — CYTOLOGY - PAP: Diagnosis: NEGATIVE

## 2019-03-11 ENCOUNTER — Ambulatory Visit: Payer: Self-pay | Admitting: Pediatrics

## 2019-03-18 ENCOUNTER — Ambulatory Visit: Payer: Self-pay | Admitting: Pediatrics

## 2019-03-29 ENCOUNTER — Encounter: Payer: Self-pay | Admitting: Pediatrics

## 2019-04-05 ENCOUNTER — Telehealth: Payer: Self-pay

## 2019-04-05 NOTE — Telephone Encounter (Signed)
Return call to pt regarding message. Pt c/o AUB requesting appt pt has nexplanon  Pt not ava left detailed message for pt to return call.

## 2019-04-07 ENCOUNTER — Encounter: Payer: Self-pay | Admitting: Obstetrics

## 2019-04-07 ENCOUNTER — Ambulatory Visit: Payer: Medicaid Other | Admitting: Obstetrics

## 2019-04-07 ENCOUNTER — Other Ambulatory Visit: Payer: Self-pay

## 2019-04-07 VITALS — BP 114/70 | HR 78 | Wt 180.0 lb

## 2019-04-07 DIAGNOSIS — N921 Excessive and frequent menstruation with irregular cycle: Secondary | ICD-10-CM | POA: Diagnosis not present

## 2019-04-07 DIAGNOSIS — Z3046 Encounter for surveillance of implantable subdermal contraceptive: Secondary | ICD-10-CM

## 2019-04-07 NOTE — Progress Notes (Signed)
Subjective:    Marissa Cross is a 29 y.o. female who presents for contraception counseling. The patient has no complaints today. The patient is sexually active. Pertinent past medical history: none.  The information documented in the HPI was reviewed and verified.  Menstrual History: OB History    Gravida  2   Para  2   Term  2   Preterm      AB      Living  2     SAB      TAB      Ectopic      Multiple  0   Live Births  2            Patient's last menstrual period was 03/12/2019.   Patient Active Problem List   Diagnosis Date Noted  . Encounter for induction of labor 10/15/2016  . Placental abruption affecting delivery 10/15/2016  . Placental abruption, delivered 10/15/2016  . Cesarean delivery delivered: Indication: placental abruption 10/15/2016  . Postpartum care following cesarean delivery (10/2) 10/15/2016  . CIN I (cervical intraepithelial neoplasia I) 11/21/2014  . 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 Papanicolaou smear of cervix with low grade squamous intraepithelial lesion (LGSIL) 09/14/2014   Past Medical History:  Diagnosis Date  . Placental abruption affecting delivery 10/15/2016  . Vaginal Pap smear, abnormal     Past Surgical History:  Procedure Laterality Date  . CESAREAN SECTION N/A 10/15/2016   Procedure: CESAREAN SECTION;  Surgeon: Shea Evans, MD;  Location: [facility]IRTHING SUIT[facility];  Service: Obstetrics;  Laterality: N/A;  . NO PAST SURGERIES       Current Outpatient Medications:  .  Azelaic Acid 15 % cream, Apply 15 % topically daily., Disp: , Rfl:  .  clindamycin (CLEOCIN T) 1 % external solution, Apply 1 application topically daily., Disp: , Rfl:  .  spironolactone (ALDACTONE) 100 MG tablet, Take 100 mg by mouth daily., Disp: , Rfl:  .  tretinoin (RETIN-A) 0.025 % cream, Apply 0.025 application topically 2 (two) times a week., Disp: , Rfl:  .  valACYclovir (VALTREX) 1000 MG tablet, 2 tablets BID x 1 day for flare up cold  sore (Patient not taking: Reported on 12/30/2018), Disp: 20 tablet, Rfl: 0 No Known Allergies  Social History   Tobacco Use  . Smoking status: Never Smoker  . Smokeless tobacco: Never Used  Substance Use Topics  . Alcohol use: Yes    Comment: holidays only; not while preg    History reviewed. No pertinent family history.     Review of Systems Constitutional: negative for weight loss Genitourinary:negative for abnormal menstrual periods and vaginal discharge   Objective:   BP 114/70   Pulse 78   Wt 180 lb (81.6 kg)   LMP 03/12/2019   BMI 31.89 kg/m    General:   alert and no distress  Skin:   no rash or abnormalities  Lungs:   clear to auscultation bilaterally  Heart:   regular rate and rhythm, S1, S2 normal, no murmur, click, rub or gallop                Pelvic: Deferred  Lab Review Urine pregnancy test Labs reviewed yes Radiologic studies reviewed yes  50% of 15 min visit spent on counseling and coordination of care.    Assessment:    29 y.o., continuing Nexplanon, no contraindications.   Plan:    All questions answered. Contraception: Nexplanon. Diagnosis explained in detail, including differential. Follow up  prn  Shelly Bombard, MD 04/07/2019 9:49 AM

## 2019-07-08 ENCOUNTER — Ambulatory Visit (INDEPENDENT_AMBULATORY_CARE_PROVIDER_SITE_OTHER): Payer: Medicaid Other | Admitting: Obstetrics

## 2019-07-08 ENCOUNTER — Other Ambulatory Visit: Payer: Self-pay

## 2019-07-08 ENCOUNTER — Encounter: Payer: Self-pay | Admitting: Obstetrics

## 2019-07-08 VITALS — BP 108/75 | HR 93 | Wt 187.6 lb

## 2019-07-08 DIAGNOSIS — Z3046 Encounter for surveillance of implantable subdermal contraceptive: Secondary | ICD-10-CM

## 2019-07-08 DIAGNOSIS — Z3009 Encounter for other general counseling and advice on contraception: Secondary | ICD-10-CM

## 2019-07-08 NOTE — Progress Notes (Signed)
NEXPLANON REMOVAL NOTE  Date of LMP:   unknown  Contraception used: *Nexplanon   Indications:  The patient desires removal of Nexplanon.  She understands risks, benefits, and alternatives to Implanon and would like to proceed.  Anesthesia:   Lidocaine 1% plain.  Procedure:  A time-out was performed confirming the procedure and the patient's allergy status.  Complications: None                      The rod was palpated and the area was sterilely prepped.  The area beneath the distal tip was anesthetized with 1% xylocaine and the skin incised                       Over the tip and the tip was exposed, grasped with forcep and removed intact.  A single suture of 4-0 Vicryl was used to close incision.  Steri strip                       And a bandage applied and the arm was wrapped with gauze bandage.  The patient tolerated well.  Instructions:  The patient was instructed to remove the dressing in 24 hours and that some bruising is to be expected.  She was advised to use over the counter analgesics as needed for any pain at the site.  She is to keep the area dry for 24 hours and to call if her hand or arm becomes cold, numb, or blue.  Return visit:  Return in 2 weeks   Brock Bad, MD 07/08/2019 3:05 PM

## 2019-07-08 NOTE — Progress Notes (Signed)
RGYN patient presents for Nexplanon Removal due to AUB.   Pt considering Pill however unsure which contraception method she wants to use.  Pt notes HA's will take Tylenol for relief.  Wants to discuss recent low blood pressure values at different office

## 2019-07-16 ENCOUNTER — Ambulatory Visit: Payer: Medicaid Other | Admitting: Obstetrics and Gynecology

## 2019-07-22 ENCOUNTER — Encounter: Payer: Self-pay | Admitting: Obstetrics

## 2019-07-22 ENCOUNTER — Other Ambulatory Visit: Payer: Self-pay

## 2019-07-22 ENCOUNTER — Ambulatory Visit (INDEPENDENT_AMBULATORY_CARE_PROVIDER_SITE_OTHER): Payer: Medicaid Other | Admitting: Obstetrics

## 2019-07-22 ENCOUNTER — Ambulatory Visit: Payer: Medicaid Other | Admitting: Obstetrics

## 2019-07-22 VITALS — BP 118/74 | HR 96 | Wt 188.0 lb

## 2019-07-22 DIAGNOSIS — Z4889 Encounter for other specified surgical aftercare: Secondary | ICD-10-CM

## 2019-07-22 DIAGNOSIS — Z3009 Encounter for other general counseling and advice on contraception: Secondary | ICD-10-CM | POA: Diagnosis not present

## 2019-07-22 DIAGNOSIS — Z30011 Encounter for initial prescription of contraceptive pills: Secondary | ICD-10-CM | POA: Diagnosis not present

## 2019-07-22 MED ORDER — NORGESTIMATE-ETH ESTRADIOL 0.25-35 MG-MCG PO TABS: 1.0000 | ORAL_TABLET | Freq: Every day | ORAL | 11 refills | Status: DC

## 2019-07-22 NOTE — Progress Notes (Signed)
Pt is here today for f/u Nexplanon removal and BC Rx.

## 2019-07-22 NOTE — Progress Notes (Signed)
Subjective:    Marissa Cross is a 29 y.o. female who presents for follow up after Nexplanon removal. The patient has no complaints today. The patient is sexually active. Pertinent past medical history: none.  The information documented in the HPI was reviewed and verified.  Menstrual History: OB History    Gravida  2   Para  2   Term  2   Preterm      AB      Living  2     SAB      TAB      Ectopic      Multiple  0   Live Births  2            No LMP recorded.   Patient Active Problem List   Diagnosis Date Noted   Encounter for induction of labor 10/15/2016   Placental abruption affecting delivery 10/15/2016   Placental abruption, delivered 10/15/2016   Cesarean delivery delivered: Indication: placental abruption 10/15/2016   Postpartum care following cesarean delivery (10/2) 10/15/2016   CIN I (cervical intraepithelial neoplasia I) 11/21/2014   Mild dysplasia of cervix (CIN I) 09/16/2014   Papanicolaou smear of cervix with low grade squamous intraepithelial lesion (LGSIL) 09/14/2014   Past Medical History:  Diagnosis Date   Placental abruption affecting delivery 10/15/2016   Vaginal Pap smear, abnormal     Past Surgical History:  Procedure Laterality Date   CESAREAN SECTION N/A 10/15/2016   Procedure: CESAREAN SECTION;  Surgeon: Shea Evans, MD;  Location: [facility] BIRTHING SUIT[facility];  Service: Obstetrics;  Laterality: N/A;   NO PAST SURGERIES       Current Outpatient Medications:    Azelaic Acid 15 % cream, Apply 15 % topically daily., Disp: , Rfl:    clindamycin (CLEOCIN T) 1 % external solution, Apply 1 application topically daily., Disp: , Rfl:    norgestimate-ethinyl estradiol (ORTHO-CYCLEN) 0.25-35 MG-MCG tablet, Take 1 tablet by mouth daily., Disp: 28 tablet, Rfl: 11   spironolactone (ALDACTONE) 100 MG tablet, Take 100 mg by mouth daily., Disp: , Rfl:    tretinoin (RETIN-A) 0.025 % cream, Apply 0.025 application topically 2 (two) times a  week., Disp: , Rfl:    valACYclovir (VALTREX) 1000 MG tablet, 2 tablets BID x 1 day for flare up cold sore (Patient not taking: Reported on 12/30/2018), Disp: 20 tablet, Rfl: 0 No Known Allergies  Social History   Tobacco Use   Smoking status: Never Smoker   Smokeless tobacco: Never Used  Substance Use Topics   Alcohol use: Yes    Comment: holidays only; not while preg    History reviewed. No pertinent family history.     Review of Systems Constitutional: negative for weight loss Genitourinary:negative for abnormal menstrual periods and vaginal discharge   Objective:   There were no vitals taken for this visit.  PE:          General: Alert and no distress          Left Upper Arm: Nexplanon removal site healing well, clean and non tender.  Suture removed.    Lab Review Urine pregnancy test Labs reviewed yes Radiologic studies reviewed no  50% of 20 min visit spent on counseling and coordination of care.    Assessment:    29 y.o., discontinuing Nexplanon and starting OCP's, no contraindications.   Plan:    All questions answered. Contraception: OCP (estrogen/progesterone). Discussed healthy lifestyle modifications. Follow up in 6 months.   Meds ordered this encounter  Medications  norgestimate-ethinyl estradiol (ORTHO-CYCLEN) 0.25-35 MG-MCG tablet    Sig: Take 1 tablet by mouth daily.    Dispense:  28 tablet    Refill:  11     Brock Bad, MD 07/23/2019 10:37 AM

## 2019-07-22 NOTE — Progress Notes (Signed)
188lb

## 2019-07-22 NOTE — Progress Notes (Signed)
Subjective:    Marissa Cross is a 29 y.o. female who presents for contraception counseling. The patient has no complaints today. The patient is sexually active. Pertinent past medical history: none.  The information documented in the HPI was reviewed and verified.  Menstrual History: OB History    Gravida  2   Para  2   Term  2   Preterm      AB      Living  2     SAB      TAB      Ectopic      Multiple  0   Live Births  2            No LMP recorded.   Patient Active Problem List   Diagnosis Date Noted  . Encounter for induction of labor 10/15/2016  . Placental abruption affecting delivery 10/15/2016  . Placental abruption, delivered 10/15/2016  . Cesarean delivery delivered: Indication: placental abruption 10/15/2016  . Postpartum care following cesarean delivery (10/2) 10/15/2016  . CIN I (cervical intraepithelial neoplasia I) 11/21/2014  . 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 Papanicolaou smear of cervix with low grade squamous intraepithelial lesion (LGSIL) 09/14/2014   Past Medical History:  Diagnosis Date  . Placental abruption affecting delivery 10/15/2016  . Vaginal Pap smear, abnormal     Past Surgical History:  Procedure Laterality Date  . CESAREAN SECTION N/A 10/15/2016   Procedure: CESAREAN SECTION;  Surgeon: Shea Evans, MD;  Location: [facility] BIRTHING SUIT[facility];  Service: Obstetrics;  Laterality: N/A;  . NO PAST SURGERIES       Current Outpatient Medications:  .  Azelaic Acid 15 % cream, Apply 15 % topically daily., Disp: , Rfl:  .  clindamycin (CLEOCIN T) 1 % external solution, Apply 1 application topically daily., Disp: , Rfl:  .  norgestimate-ethinyl estradiol (ORTHO-CYCLEN) 0.25-35 MG-MCG tablet, Take 1 tablet by mouth daily., Disp: 28 tablet, Rfl: 11 .  spironolactone (ALDACTONE) 100 MG tablet, Take 100 mg by mouth daily., Disp: , Rfl:  .  tretinoin (RETIN-A) 0.025 % cream, Apply 0.025 application topically 2 (two) times a week.,  Disp: , Rfl:  .  valACYclovir (VALTREX) 1000 MG tablet, 2 tablets BID x 1 day for flare up cold sore (Patient not taking: Reported on 12/30/2018), Disp: 20 tablet, Rfl: 0 No Known Allergies  Social History   Tobacco Use  . Smoking status: Never Smoker  . Smokeless tobacco: Never Used  Substance Use Topics  . Alcohol use: Yes    Comment: holidays only; not while preg    History reviewed. No pertinent family history.     Review of Systems Constitutional: negative for weight loss Genitourinary:negative for abnormal menstrual periods and vaginal discharge   Objective:   BP 118/74   Pulse 96   Wt 188 lb (85.3 kg)   BMI 33.30 kg/m    General:   alert  Skin:   no rash or abnormalities  Lungs:   clear to auscultation bilaterally  Heart:   regular rate and rhythm, S1, S2 normal, no murmur, click, rub or gallop  Breasts:   normal without suspicious masses, skin or nipple changes or axillary nodes  Abdomen:  normal findings: no organomegaly, soft, non-tender and no hernia  Pelvis:  External genitalia: normal general appearance Urinary system: urethral meatus normal and bladder without fullness, nontender Vaginal: normal without tenderness, induration or masses Cervix: normal appearance Adnexa: normal bimanual exam Uterus: anteverted and non-tender, normal size  Lab Review Urine pregnancy test Labs reviewed yes Radiologic studies reviewed no  50% of 15 min visit spent on counseling and coordination of care.    Assessment:    29 y.o., discontinuing Nexplanon and starting OCP's ( [facility] ), no contraindications.   Plan:   1. Encounter for postoperative wound care - suture removed  2. Encounter for other general counseling and advice on contraception - wants OCP's  3. Encounter for initial prescription of contraceptive pills - [facility] Rx with a year of refills   All questions answered. Discussed healthy lifestyle modifications. Follow up in 6  months.   Brock Bad, MD 07/22/2019 4:52 PM

## 2020-01-11 ENCOUNTER — Other Ambulatory Visit: Payer: Self-pay

## 2020-01-11 ENCOUNTER — Encounter (HOSPITAL_COMMUNITY): Payer: Self-pay | Admitting: Emergency Medicine

## 2020-01-11 ENCOUNTER — Emergency Department (HOSPITAL_COMMUNITY)
Admission: EM | Admit: 2020-01-11 | Discharge: 2020-01-12 | Disposition: A | Discharge status: Home / Self Care | Payer: Medicaid Other | Attending: Emergency Medicine | Admitting: Emergency Medicine

## 2020-01-11 DIAGNOSIS — R000 Tachycardia, unspecified: Secondary | ICD-10-CM | POA: Diagnosis not present

## 2020-01-11 DIAGNOSIS — R519 Headache, unspecified: Secondary | ICD-10-CM | POA: Diagnosis present

## 2020-01-11 DIAGNOSIS — U071 COVID-19: Secondary | ICD-10-CM | POA: Diagnosis not present

## 2020-01-11 LAB — CBC
HCT: 42.2 % (ref 36.0–46.0)
Hemoglobin: 14.9 g/dL (ref 12.0–15.0)
MCH: 31 pg (ref 26.0–34.0)
MCHC: 35.3 g/dL (ref 30.0–36.0)
MCV: 87.7 fL (ref 80.0–100.0)
Platelets: 250 10*3/uL (ref 150–400)
RBC: 4.81 MIL/uL (ref 3.87–5.11)
RDW: 12.5 % (ref 11.5–15.5)
WBC: 5.7 10*3/uL (ref 4.0–10.5)
nRBC: 0 % (ref 0.0–0.2)

## 2020-01-11 LAB — BASIC METABOLIC PANEL
Anion gap: 11 (ref 5–15)
BUN: 5 mg/dL — ABNORMAL LOW (ref 6–20)
CO2: 24 mmol/L (ref 22–32)
Calcium: 9.7 mg/dL (ref 8.9–10.3)
Chloride: 104 mmol/L (ref 98–111)
Creatinine, Ser: 0.75 mg/dL (ref 0.44–1.00)
GFR, Estimated: >60 mL/min (ref >60)
Glucose, Bld: 93 mg/dL (ref 70–99)
Potassium: 3.9 mmol/L (ref 3.5–5.1)
Sodium: 139 mmol/L (ref 135–145)

## 2020-01-11 LAB — URINALYSIS, ROUTINE W REFLEX MICROSCOPIC
Bilirubin Urine: NEGATIVE
Glucose, UA: NEGATIVE mg/dL
Hgb urine dipstick: NEGATIVE
Ketones, ur: 5 mg/dL — AB
Leukocytes,Ua: NEGATIVE
Nitrite: NEGATIVE
Protein, ur: NEGATIVE mg/dL
Specific Gravity, Urine: 1.01 (ref 1.005–1.030)
pH: 5 (ref 5.0–8.0)

## 2020-01-11 LAB — I-STAT BETA HCG BLOOD, ED (MC, WL, AP ONLY): I-stat hCG, quantitative: 5.1 m[IU]/mL — ABNORMAL HIGH (ref <5)

## 2020-01-11 MED ORDER — ACETAMINOPHEN 325 MG PO TABS
650.0000 mg | ORAL_TABLET | Freq: Once | ORAL | Status: AC | PRN
  Administered 2020-01-11: 650 mg via ORAL
  Filled 2020-01-11: qty 2

## 2020-01-11 NOTE — ED Triage Notes (Signed)
Pt c/o headache, body aches and states she passed out on the way here. A&O x 4, no neuro deficits noted at this time.

## 2020-01-12 LAB — RESP PANEL BY RT-PCR (FLU A&B, COVID) ARPGX2
Influenza A by PCR: NEGATIVE
Influenza B by PCR: NEGATIVE
SARS Coronavirus 2 by RT PCR: POSITIVE — AB

## 2020-01-12 MED ORDER — ACETAMINOPHEN 325 MG PO TABS
650.0000 mg | ORAL_TABLET | Freq: Once | ORAL | Status: AC
  Administered 2020-01-12: 650 mg via ORAL
  Filled 2020-01-12: qty 2

## 2020-01-12 MED ORDER — IBUPROFEN 400 MG PO TABS
600.0000 mg | ORAL_TABLET | Freq: Once | ORAL | Status: AC
  Administered 2020-01-12: 600 mg via ORAL
  Filled 2020-01-12: qty 1

## 2020-01-12 NOTE — ED Provider Notes (Signed)
[facility] EMERGENCY DEPARTMENT Provider Note   CSN: [id number] Arrival date & time: 01/11/20  1859     History Chief Complaint  Patient presents with  . Loss of Consciousness    Marissa Cross is a 29 y.o. female with no relevant past medical history presents the ED with complaints of headache and body aches.  She also states that while on the way here she fainted.  On my examination, patient states that she had recently returned home with her fianc and children from celebrating Christmas.  She states that she had been feeling mildly fatigued with generalized malaise over the course of the past 36 hours.  She also endorsed mild nausea, but no emesis and is still eating and drinking well.  She states that yesterday at around 4:30 PM she developed generalized body aches.  She is now complaining of generalized aches from head to toe.  She is also endorsing photophobia and mild congestion symptoms.  She denies any significant cough.  No pleuritic symptoms, history of clots or clotting disorder, hemoptysis, emesis, or inability to tolerate food or liquid by mouth.  Evidently while she was in the car with her husband she fainted.  She states that it has happened in the past and is often associated with stress.  Her husband did not realize that she had fainted and states that she has been "pushing herself" too hard these past few days.  She denies any presyncopal prodrome and instead just feels as though she was so worn out and stressed that it just sort of "happened".  When I asked her fianc about it who was on the phone, he states that he did not realize that she even passed out and states that he was alert while he was in the car.  He only stepped out briefly to help with a friend who was on the side of the road.  She is not immunized for COVID-19.  Patient's most recent menses concluded 3 days ago.    HPI     Past Medical History:  Diagnosis Date  . Placental abruption  affecting delivery 10/15/2016  . Vaginal Pap smear, abnormal     Patient Active Problem List   Diagnosis Date Noted  . Encounter for induction of labor 10/15/2016  . Placental abruption affecting delivery 10/15/2016  . Placental abruption, delivered 10/15/2016  . Cesarean delivery delivered: Indication: placental abruption 10/15/2016  . Postpartum care following cesarean delivery (10/2) 10/15/2016  . CIN I (cervical intraepithelial neoplasia I) 11/21/2014  . 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 CESAREAN SECTION N/A 10/15/2016   Procedure: CESAREAN SECTION;  Surgeon: Shea Evans, MD;  Location: [facility] BIRTHING SUITES;  Service: Obstetrics;  Laterality: N/A;  . NO PAST SURGERIES       OB History    Gravida  2   Para  2   Term  2   Preterm      AB      Living  2     SAB      IAB      Ectopic      Multiple  0   Live Births  2           No family history on file.  Social History   Tobacco Use  . Smoking status: Never Smoker  . Smokeless  tobacco: Never Used  Vaping Use  . Vaping Use: Never used  Substance Use Topics  . Alcohol use: Yes    Comment: holidays only; not while preg  . Drug use: No    Home Medications Prior to Admission medications   Medication Sig Start Date End Date Taking? Authorizing Provider  Azelaic Acid 15 % cream Apply 15 % topically daily. 08/27/18   [provider]  clindamycin (CLEOCIN T) 1 % external solution Apply 1 application topically daily. 08/27/18   [provider]  norgestimate-ethinyl estradiol (ORTHO-CYCLEN) 0.25-35 MG-MCG tablet Take 1 tablet by mouth daily. 07/22/19   Brock Bad, MD  spironolactone (ALDACTONE) 100 MG tablet Take 100 mg by mouth daily. 12/14/18   [provider]  tretinoin (RETIN-A) 0.025 % cream Apply 0.025  application topically 2 (two) times a week. 08/29/18   [provider]  valACYclovir (VALTREX) 1000 MG tablet 2 tablets BID x 1 day for flare up cold sore Patient not taking: Reported on 12/30/2018 08/26/17   Tysinger, Kermit Balo, PA-C    Allergies    Patient has no known allergies.  Review of Systems   Review of Systems  All other systems reviewed and are negative.   Physical Exam Updated Vital Signs BP 106/69   Pulse 79   Temp 98.4 F (36.9 C) (Oral)   Resp 17   SpO2 97%   Physical Exam Vitals and nursing note reviewed. Exam conducted with a chaperone present.  Constitutional:      Appearance: Normal appearance.  HENT:     Head: Normocephalic and atraumatic.  Eyes:     General: No scleral icterus.    Conjunctiva/sclera: Conjunctivae normal.  Cardiovascular:     Rate and Rhythm: Normal rate and regular rhythm.     Pulses: Normal pulses.     Heart sounds: Normal heart sounds.  Pulmonary:     Effort: Pulmonary effort is normal. No respiratory distress.     Breath sounds: Normal breath sounds. No wheezing or rales.     Comments: Speaks in full sentences.  No increased WOB.  No tachypnea.  97% on RA.   Musculoskeletal:     Cervical back: Normal range of motion.     Right lower leg: No edema.     Left lower leg: No edema.  Skin:    General: Skin is dry.     Capillary Refill: Capillary refill takes less than 2 seconds.  Neurological:     General: No focal deficit present.     Mental Status: She is alert and oriented to person, place, and time.     GCS: GCS eye subscore is 4. GCS verbal subscore is 5. GCS motor subscore is 6.  Psychiatric:        Mood and Affect: Mood normal.        Behavior: Behavior normal.        Thought Content: Thought content normal.     ED Results / Procedures / Treatments   Labs (all labs ordered are listed, but only abnormal results are displayed) Labs Reviewed  RESP PANEL BY RT-PCR (FLU A&B, COVID) ARPGX2 - Abnormal; Notable for  the following components:      Result Value   SARS Coronavirus 2 by RT PCR POSITIVE (*)    All other components within normal limits  BASIC METABOLIC PANEL - Abnormal; Notable for the following components:   BUN 5 (*)    All other components within normal limits  URINALYSIS, ROUTINE  W REFLEX MICROSCOPIC - Abnormal; Notable for the following components:   Color, Urine STRAW (*)    Ketones, ur 5 (*)    All other components within normal limits  I-STAT BETA HCG BLOOD, ED ([facility], [facility], [facility] ONLY) - Abnormal; Notable for the following components:   I-stat hCG, quantitative 5.1 (*)    All other components within normal limits  CBC  CBG MONITORING, ED    EKG EKG Interpretation  Date/Time:  Tuesday January 11 2020 20:52:59 EST Ventricular Rate:  118 PR Interval:  144 QRS Duration: 72 QT Interval:  286 QTC Calculation: 400 R Axis:   19 Text Interpretation: Sinus tachycardia Anterior infarct , age undetermined Abnormal ECG When compared with ECG of 02/21/2017, HEART RATE has increased Confirmed by Dione Booze ([id number]) on 01/12/2020 12:30:48 AM   Radiology No results found.  Procedures Procedures (including critical care time)  Medications Ordered in ED Medications  ibuprofen (ADVIL) tablet 600 mg (has no administration in time range)  acetaminophen (TYLENOL) tablet 650 mg (650 mg Oral Given 01/11/20 2359)  acetaminophen (TYLENOL) tablet 650 mg (650 mg Oral Given 01/12/20 6222)    ED Course  I have reviewed the triage vital signs and the nursing notes.  Pertinent labs & imaging results that were available during my care of the patient were reviewed by me and considered in my medical decision making (see chart for details).  Clinical Course as of 01/12/20 1133  Wed Jan 12, 2020  1058 SARS Coronavirus 2 by RT PCR(!): POSITIVE [GG]    Clinical Course User Index [GG] Lorelee New, PA-C   MDM Rules/Calculators/A&P                          While she was sinus tachycardia to 118  bpm, this was obtained 12 hours prior to my examination when she was also noted to be febrile to 102.9 F.  On my exam, her heart rate is now well controlled at 75 bpm.  She is hemodynamically stable and in no acute distress.  Her BMP is without evidence of AKI or electrolyte derangement.  CBC is within normal limits.  Her laboratory work-up is notable only for positive COVID-19 testing on her respiratory panel by PCR.  Patient cannot be PERC'd out given her use of oral contraceptives, she is 0.0 points and low risk per Wells Criteria for PE.  I suspect that her quesitonable fainting episode was in the context of her fever and fatigue related to active COVID-19 infection.    We will reach out to the [facility] infusion center and have them contact patient to see if she is a viable candidate given her elevated BMI and unvaccinated status.  Patient will isolate at home.  Encouraged to follow-up with her primary care provider regarding today's encounter.  ED return precautions discussed.  Final Clinical Impression(s) / ED Diagnoses Final diagnoses:  COVID-19    Rx / DC Orders ED Discharge Orders    None       Lorelee New, PA-C 01/12/20 1133    Pricilla Loveless, MD 01/13/20 1408

## 2020-01-12 NOTE — Discharge Instructions (Addendum)
You have tested positive for COVID-19.  The remainder of your laboratory work-up and physical exam was reassuring.  Your vital signs are all stable and within normal limits.  I strongly encourage you to maintain isolation precautions at home.  I have reached out to the [facility] infusion center and they will contact you to see if you are a candidate.  However, they are low on treatments and you are lower risk given lack of significant comorbidities, so I cannot guarantee treatment.  Instead, I recommend over-the-counter medications for symptomatic relief.  Per discussion, recommend obtaining an oral thermometer and check your temperature regularly.  Please take Tylenol or ibuprofen as needed for fever control.  I would like for you to take ibuprofen 600 mg every 6 hours as needed for body aches/fevers.  This should also help with your headache symptoms.  You may also consider obtaining a pulse oximeter over-the-counter to pharmacy if you begin to feel short of breath.  Please notify your primary care provider of today's encounter.  Return to the ED or seek immediate medical attention should you experience any new or worsening symptoms.

## 2020-01-12 NOTE — ED Notes (Signed)
Reviewed discharge instructions with patient. Follow-up care and symptom management reviewed. Patient and verbalized understanding. Patient A&Ox4, VSS, and ambulatory with steady gait upon discharge.

## 2020-01-24 ENCOUNTER — Other Ambulatory Visit: Payer: Medicaid Other

## 2020-01-24 ENCOUNTER — Other Ambulatory Visit: Payer: Self-pay

## 2020-01-24 DIAGNOSIS — Z20822 Contact with and (suspected) exposure to covid-19: Secondary | ICD-10-CM

## 2020-01-26 LAB — SARS-COV-2, NAA 2 DAY TAT

## 2020-01-26 LAB — NOVEL CORONAVIRUS, NAA: SARS-CoV-2, NAA: NOT DETECTED

## 2020-01-29 ENCOUNTER — Telehealth: Payer: Self-pay

## 2020-01-29 NOTE — Telephone Encounter (Signed)

## 2020-02-03 ENCOUNTER — Encounter (HOSPITAL_COMMUNITY): Payer: Self-pay | Admitting: Emergency Medicine

## 2020-05-11 ENCOUNTER — Ambulatory Visit: Payer: Medicaid Other | Attending: Internal Medicine

## 2020-05-11 DIAGNOSIS — Z20822 Contact with and (suspected) exposure to covid-19: Secondary | ICD-10-CM

## 2020-05-12 LAB — NOVEL CORONAVIRUS, NAA: SARS-CoV-2, NAA: DETECTED — AB

## 2020-05-12 LAB — SARS-COV-2, NAA 2 DAY TAT

## 2020-07-06 ENCOUNTER — Emergency Department (HOSPITAL_COMMUNITY)
Admission: EM | Admit: 2020-07-06 | Discharge: 2020-07-06 | Disposition: A | Discharge status: Home / Self Care | Payer: Medicaid Other | Attending: Emergency Medicine | Admitting: Emergency Medicine

## 2020-07-06 ENCOUNTER — Encounter (HOSPITAL_COMMUNITY): Payer: Self-pay | Admitting: Emergency Medicine

## 2020-07-06 ENCOUNTER — Emergency Department (HOSPITAL_COMMUNITY): Payer: Medicaid Other

## 2020-07-06 DIAGNOSIS — R1031 Right lower quadrant pain: Secondary | ICD-10-CM | POA: Diagnosis not present

## 2020-07-06 LAB — URINALYSIS, ROUTINE W REFLEX MICROSCOPIC
Bilirubin Urine: NEGATIVE
Glucose, UA: NEGATIVE mg/dL
Hgb urine dipstick: NEGATIVE
Ketones, ur: NEGATIVE mg/dL
Nitrite: NEGATIVE
Protein, ur: NEGATIVE mg/dL
Specific Gravity, Urine: 1.02 (ref 1.005–1.030)
pH: 7 (ref 5.0–8.0)

## 2020-07-06 LAB — COMPREHENSIVE METABOLIC PANEL
ALT: 20 U/L (ref 0–44)
AST: 17 U/L (ref 15–41)
Albumin: 4.1 g/dL (ref 3.5–5.0)
Alkaline Phosphatase: 68 U/L (ref 38–126)
Anion gap: 9 (ref 5–15)
BUN: 9 mg/dL (ref 6–20)
CO2: 23 mmol/L (ref 22–32)
Calcium: 9.3 mg/dL (ref 8.9–10.3)
Chloride: 104 mmol/L (ref 98–111)
Creatinine, Ser: 0.61 mg/dL (ref 0.44–1.00)
GFR, Estimated: >60 mL/min (ref >60)
Glucose, Bld: 93 mg/dL (ref 70–99)
Potassium: 3.6 mmol/L (ref 3.5–5.1)
Sodium: 136 mmol/L (ref 135–145)
Total Bilirubin: 0.7 mg/dL (ref 0.3–1.2)
Total Protein: 6.8 g/dL (ref 6.5–8.1)

## 2020-07-06 LAB — LIPASE, BLOOD: Lipase: 28 U/L (ref 11–51)

## 2020-07-06 LAB — I-STAT BETA HCG BLOOD, ED (MC, WL, AP ONLY): I-stat hCG, quantitative: <5 m[IU]/mL (ref <5)

## 2020-07-06 LAB — CBC
HCT: 42.2 % (ref 36.0–46.0)
Hemoglobin: 14.3 g/dL (ref 12.0–15.0)
MCH: 29.7 pg (ref 26.0–34.0)
MCHC: 33.9 g/dL (ref 30.0–36.0)
MCV: 87.6 fL (ref 80.0–100.0)
Platelets: 270 10*3/uL (ref 150–400)
RBC: 4.82 MIL/uL (ref 3.87–5.11)
RDW: 12.4 % (ref 11.5–15.5)
WBC: 4 10*3/uL (ref 4.0–10.5)
nRBC: 0 % (ref 0.0–0.2)

## 2020-07-06 MED ORDER — SODIUM CHLORIDE 0.9 % IV BOLUS
1000.0000 mL | Freq: Once | INTRAVENOUS | Status: AC
  Administered 2020-07-06: 1000 mL via INTRAVENOUS

## 2020-07-06 MED ORDER — DICYCLOMINE HCL 20 MG PO TABS: 20.0000 mg | ORAL_TABLET | Freq: Two times a day (BID) | ORAL | 0 refills | Status: DC

## 2020-07-06 MED ORDER — ONDANSETRON 4 MG PO TBDP: 4.0000 mg | ORAL_TABLET | Freq: Three times a day (TID) | ORAL | 0 refills | Status: DC | PRN

## 2020-07-06 MED ORDER — IOHEXOL 300 MG/ML  SOLN
100.0000 mL | Freq: Once | INTRAMUSCULAR | Status: AC | PRN
  Administered 2020-07-06: 100 mL via INTRAVENOUS

## 2020-07-06 MED ORDER — MORPHINE SULFATE (PF) 4 MG/ML IV SOLN
4.0000 mg | Freq: Once | INTRAVENOUS | Status: AC
  Administered 2020-07-06: 4 mg via INTRAVENOUS
  Filled 2020-07-06: qty 1

## 2020-07-06 MED ORDER — ONDANSETRON HCL 4 MG/2ML IJ SOLN
4.0000 mg | Freq: Once | INTRAMUSCULAR | Status: AC
  Administered 2020-07-06: 4 mg via INTRAVENOUS
  Filled 2020-07-06: qty 2

## 2020-07-06 NOTE — ED Provider Notes (Signed)
[facility]-EMERGENCY DEPT Provider Note   CSN: [id number] Arrival date & time: 07/06/20  1013     History Chief Complaint  Patient presents with   Abdominal Pain    Marissa Cross is a 30 y.o. female.  HPI Patient is a 30 year old female with past medical history significant for C-section, no other pertinent past medical history apart from pregnancy complications   30 year old G2P2 female brought in by EMS for RLQ abdominal pain radiating into the right flank x 3 days.   Describes the pain as "cramping" that has progressively worsened since onset. Took IBU with minimal relief. Reports associated nausea and decreased appetite. No vomiting or diarrhea. No fever or chills. BMs have been regular. No urinary symptoms. Marissa Cross is unsure about Marissa Cross LMP, says it was approx. 10 days ago but lighter than normal and unsure if it was late or early. Denies current vaginal bleeding or abnormal discharge. Marissa Cross has been inconsistently using OCPs for contraception, not currently seeking pregnancy.  Reports being treated with prednisone last week for acute URI and ear infection. Denies any abx use. Flu & covid tests were negative at the time. Marissa Cross reports all of Marissa Cross symptoms related to this have improved/resolved. Marissa Cross is not currently taking any other medications.   No fevers or chills cough or congestion currently.  Has not taken medications for pain prior to arrival.     Past Medical History:  Diagnosis Date   Placental abruption affecting delivery 10/15/2016   Vaginal Pap smear, abnormal     Patient Active Problem List   Diagnosis Date Noted   Encounter for induction of labor 10/15/2016   Placental abruption affecting delivery 10/15/2016   Placental abruption, delivered 10/15/2016   Cesarean delivery delivered: Indication: placental abruption 10/15/2016   Postpartum care following cesarean delivery (10/2) 10/15/2016   CIN I (cervical intraepithelial neoplasia I) 11/21/2014    Mild dysplasia of cervix (CIN I) 09/16/2014   Papanicolaou smear of cervix with low grade squamous intraepithelial lesion (LGSIL) 09/14/2014    Past Surgical History:  Procedure Laterality Date   CESAREAN SECTION N/A 10/15/2016   Procedure: CESAREAN SECTION;  Surgeon: Shea Evans, MD;  Location: [facility] BIRTHING SUITES;  Service: Obstetrics;  Laterality: N/A;   NO PAST SURGERIES       OB History     Gravida  2   Para  2   Term  2   Preterm  0   AB  0   Living  2      SAB  0   IAB  0   Ectopic  0   Multiple      Live Births  2           No family history on file.  Social History   Tobacco Use   Smoking status: Never   Smokeless tobacco: Never  Vaping Use   Vaping Use: Never used  Substance Use Topics   Alcohol use: Yes    Comment: holidays only; not while preg   Drug use: No    Home Medications Prior to Admission medications   Medication Sig Start Date End Date Taking? Authorizing Provider  dicyclomine (BENTYL) 20 MG tablet Take 1 tablet (20 mg total) by mouth 2 (two) times daily. 07/06/20  Yes Audelia Knape S, PA  ondansetron (ZOFRAN ODT) 4 MG disintegrating tablet Take 1 tablet (4 mg total) by mouth every 8 (eight) hours as needed for nausea or vomiting. 07/06/20  Yes Gailen Shelter, PA  norgestimate-ethinyl estradiol (ORTHO-CYCLEN) 0.25-35 MG-MCG tablet Take 1 tablet by mouth daily. 07/22/19   Brock Bad, MD  valACYclovir (VALTREX) 1000 MG tablet 2 tablets BID x 1 day for flare up cold sore 08/26/17   Tysinger, Kermit Balo, PA-C    Allergies    Patient has no known allergies.  Review of Systems   Review of Systems  Constitutional:  Negative for chills and fever.  HENT:  Negative for congestion.   Eyes:  Negative for pain.  Respiratory:  Negative for cough and shortness of breath.   Cardiovascular:  Negative for chest pain and leg swelling.  Gastrointestinal:  Positive for abdominal pain. Negative for vomiting.  Genitourinary:  Negative for  dysuria.  Musculoskeletal:  Negative for myalgias.  Skin:  Negative for rash.  Neurological:  Negative for dizziness and headaches.   Physical Exam Updated Vital Signs BP 90/61   Pulse 62   Temp 98.6 F (37 C) (Oral)   Resp 16   SpO2 100%   Physical Exam Vitals and nursing note reviewed.  Constitutional:      General: Marissa Cross is not in acute distress.    Comments: Pleasant well-appearing 30 year old.  In no acute distress.  Sitting comfortably in bed.  Able answer questions appropriately follow commands. No increased work of breathing. Speaking in full sentences.  HENT:     Head: Normocephalic and atraumatic.     Nose: Nose normal.  Eyes:     General: No scleral icterus. Cardiovascular:     Rate and Rhythm: Normal rate and regular rhythm.     Pulses: Normal pulses.     Heart sounds: Normal heart sounds.  Pulmonary:     Effort: Pulmonary effort is normal. No respiratory distress.     Breath sounds: No wheezing.  Abdominal:     Palpations: Abdomen is soft.     Tenderness: There is abdominal tenderness in the right lower quadrant.     Comments: Some RLQ TTP without guarding or rebound  Musculoskeletal:     Cervical back: Normal range of motion.     Right lower leg: No edema.     Left lower leg: No edema.  Skin:    General: Skin is warm and dry.     Capillary Refill: Capillary refill takes less than 2 seconds.  Neurological:     Mental Status: Marissa Cross is alert. Mental status is at baseline.  Psychiatric:        Mood and Affect: Mood normal.        Behavior: Behavior normal.    ED Results / Procedures / Treatments   Labs (all labs ordered are listed, but only abnormal results are displayed) Labs Reviewed  URINALYSIS, ROUTINE W REFLEX MICROSCOPIC - Abnormal; Notable for the following components:      Result Value   APPearance HAZY (*)    Leukocytes,Ua SMALL (*)    Bacteria, UA RARE (*)    All other components within normal limits  LIPASE, BLOOD  COMPREHENSIVE METABOLIC  PANEL  CBC  I-STAT BETA HCG BLOOD, ED ([facility], [facility], [facility] ONLY)    EKG None  Radiology CT ABDOMEN PELVIS W CONTRAST  Result Date: 07/06/2020 CLINICAL DATA:  Lower quadrant pain for 3 days, initial encounter EXAM: CT ABDOMEN AND PELVIS WITH CONTRAST TECHNIQUE: Multidetector CT imaging of the abdomen and pelvis was performed using the standard protocol following bolus administration of intravenous contrast. CONTRAST:  OMNIPAQUE IOHEXOL 300 MG/ML  SOLN COMPARISON:  02/21/2017 FINDINGS: Lower chest: No acute  abnormality. Hepatobiliary: Mild fatty infiltration of the liver is noted. Gallbladder is well distended. Pancreas: Unremarkable. No pancreatic ductal dilatation or surrounding inflammatory changes. Spleen: Small subcapsular cyst is noted in the spleen laterally measuring approximately 1.5 cm in greatest dimension. Adrenals/Urinary Tract: Adrenal glands are within normal limits. Kidneys demonstrate a normal enhancement pattern bilaterally. Nonobstructing stone is noted in lower pole of the right kidney measuring approximately 5 mm. No obstructive changes are noted. Bladder is well distended. Stomach/Bowel: The appendix is well visualized and within normal limits. No inflammatory changes are seen. No obstructive or inflammatory changes of the colon are noted. Small bowel is within normal limits. Stomach is unremarkable. Vascular/Lymphatic: No significant vascular findings are present. No enlarged abdominal or pelvic lymph nodes. Reproductive: Uterus and bilateral adnexa are unremarkable. Other: No abdominal wall hernia or abnormality. No abdominopelvic ascites. Musculoskeletal: No acute or significant osseous findings. IMPRESSION: 5 mm nonobstructing stone in the lower pole of the right kidney. Small cyst is noted within the spleen simple in nature. No acute abnormality to correspond with the given clinical history is noted. Electronically Signed   By: Alcide CleverMark  Lukens M.D.   On: 07/06/2020 15:05     Procedures Procedures   Medications Ordered in ED Medications  sodium chloride 0.9 % bolus 1,000 mL (0 mLs Intravenous Stopped 07/06/20 1544)  morphine 4 MG/ML injection 4 mg (4 mg Intravenous Given 07/06/20 1326)  ondansetron (ZOFRAN) injection 4 mg (4 mg Intravenous Given 07/06/20 1325)  iohexol (OMNIPAQUE) 300 MG/ML solution 100 mL (100 mLs Intravenous Contrast Given 07/06/20 1418)    ED Course  I have reviewed the triage vital signs and the nursing notes.  Pertinent labs & imaging results that were available during my care of the patient were reviewed by me and considered in my medical decision making (see chart for details).    MDM Rules/Calculators/A&P                          Patient is 30 year old female presented today for lower quadrant abdominal pain is quite tender on examination otherwise physical exam is quite reassuring.  Given timeline chronicity, intensity of pain, decreased appetite, location of pain I do have some concern for appendicitis very low suspicion for ovarian torsion given that seems to have been gradually worsening.  CBC CMP lipase and i-STAT hCG unremarkable.  Urinalysis also without any significant signs of infection and patient is having no urinary symptoms CT abdomen pelvis confirmed no abdomen pathology specifically no appendicitis.  Patient feels completely improved after 1 dose of morphine and Zofran.  Will discharge home with Zofran and Bentyl.  Return precautions given.  Marissa Cross will follow-up with PCP.   Gus PumaYKIA Moger was evaluated in Emergency Department on 07/07/2020 for the symptoms described in the history of present illness. Marissa Cross was evaluated in the context of the global COVID-19 pandemic, which necessitated consideration that the patient might be at risk for infection with the SARS-CoV-2 virus that causes COVID-19. Institutional protocols and algorithms that pertain to the evaluation of patients at risk for COVID-19 are in a state of rapid change based  on information released by regulatory bodies including the CDC and federal and state organizations. These policies and algorithms were followed during the patient's care in the ED.   Final Clinical Impression(s) / ED Diagnoses Final diagnoses:  Right lower quadrant abdominal pain    Rx / DC Orders ED Discharge Orders  Ordered    dicyclomine (BENTYL) 20 MG tablet  2 times daily        07/06/20 1518    ondansetron (ZOFRAN ODT) 4 MG disintegrating tablet  Every 8 hours PRN        07/06/20 1518             Gailen Shelter, [place] 07/07/20 1239    Koleen Distance, MD 07/07/20 1324

## 2020-07-06 NOTE — Discharge Instructions (Addendum)
Your CT scan was without any remarkable abnormality today.  I recommend taking Tylenol ibuprofen for pain.  I also prescribe Bentyl.  Please Zofran for nausea.  Your work-up today was overall quite reassuring.  Please follow-up with your primary care doctor within the next week.  Return to the ER for any new or concerning symptoms.

## 2020-07-06 NOTE — ED Triage Notes (Signed)
Per EMS, RLQ pain for 3 days-no N/V ?D-states she does not know her LMP

## 2020-07-06 NOTE — ED Notes (Signed)
Pt is aware that a urine sample is needed.  

## 2021-01-31 ENCOUNTER — Encounter: Payer: Self-pay | Admitting: Neurology

## 2021-02-09 ENCOUNTER — Other Ambulatory Visit: Payer: Self-pay

## 2021-02-09 ENCOUNTER — Ambulatory Visit: Payer: Medicaid Other | Admitting: Student

## 2021-02-09 ENCOUNTER — Ambulatory Visit (INDEPENDENT_AMBULATORY_CARE_PROVIDER_SITE_OTHER): Payer: Medicaid Other | Admitting: Neurology

## 2021-02-09 ENCOUNTER — Encounter: Payer: Self-pay | Admitting: Neurology

## 2021-02-09 VITALS — BP 98/63 | HR 78 | Ht 63.0 in | Wt 187.8 lb

## 2021-02-09 DIAGNOSIS — R404 Transient alteration of awareness: Secondary | ICD-10-CM | POA: Diagnosis not present

## 2021-02-09 DIAGNOSIS — R000 Tachycardia, unspecified: Secondary | ICD-10-CM | POA: Diagnosis not present

## 2021-02-09 NOTE — Patient Instructions (Signed)
Good to meet you.  Schedule MRI brain with and without contrast  2. Schedule 1-hour EEG  3. Schedule EKG  4. Discuss referral to psychiatry and therapy for anxiety and depression  5. As per [place] driving laws, no driving after an episode of loss of awareness until 6 months event-free  6. Follow-up after tests, call for any changes

## 2021-02-09 NOTE — Progress Notes (Signed)
EKG 02/09/2021: Sinus rhythm rate of 76 bpm.  Normal axis.  Poor R wave progression, likely normal variant.  Normal EKG.   Rayford Halsted, PA-C 02/09/2021, 11:20 AM Office: [phone]  CC: Dr. Patrcia Dolly
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
## 2021-02-15 ENCOUNTER — Telehealth: Payer: Self-pay

## 2021-02-15 NOTE — Telephone Encounter (Signed)
-----   Message from Cameron Sprang, MD sent at 02/12/2021  2:15 PM EST ----- Pls let her know EKG was normal, proceed with other tests as scheduled, thanks

## 2021-02-15 NOTE — Telephone Encounter (Signed)
Pt called no answer per DPR left a voice mail EKG was normal, proceed with other tests as scheduled

## 2021-02-19 ENCOUNTER — Ambulatory Visit
Admission: RE | Admit: 2021-02-19 | Discharge: 2021-02-19 | Disposition: A | Discharge status: Home / Self Care | Payer: Medicaid Other | Source: Ambulatory Visit | Attending: Neurology | Admitting: Neurology

## 2021-02-19 DIAGNOSIS — R404 Transient alteration of awareness: Secondary | ICD-10-CM

## 2021-02-19 MED ORDER — GADOBENATE DIMEGLUMINE 529 MG/ML IV SOLN
17.0000 mL | Freq: Once | INTRAVENOUS | Status: AC | PRN
  Administered 2021-02-19: 17 mL via INTRAVENOUS

## 2021-02-21 ENCOUNTER — Telehealth: Payer: Self-pay

## 2021-02-21 NOTE — Telephone Encounter (Signed)
Pt called informed MRI is normal, no tumor, stroke, or bleed. Proceed with EEG as scheduled

## 2021-02-21 NOTE — Telephone Encounter (Signed)
-----   Message from Van Clines, MD sent at 02/21/2021  9:50 AM EST ----- Pls let her know brain MRI is normal, no tumor, stroke, or bleed. Proceed with EEG as scheduled. Thanks

## 2021-03-05 ENCOUNTER — Ambulatory Visit: Payer: Medicaid Other | Admitting: Neurology

## 2021-03-05 ENCOUNTER — Other Ambulatory Visit: Payer: Self-pay

## 2021-03-05 DIAGNOSIS — R404 Transient alteration of awareness: Secondary | ICD-10-CM

## 2021-03-08 ENCOUNTER — Other Ambulatory Visit: Payer: Self-pay

## 2021-03-08 ENCOUNTER — Ambulatory Visit: Payer: Medicaid Other | Admitting: Neurology

## 2021-03-08 ENCOUNTER — Encounter: Payer: Self-pay | Admitting: Neurology

## 2021-03-08 VITALS — BP 113/64 | HR 78 | Ht 63.0 in | Wt 188.6 lb

## 2021-03-08 DIAGNOSIS — R404 Transient alteration of awareness: Secondary | ICD-10-CM | POA: Diagnosis not present
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
## 2021-06-22 ENCOUNTER — Encounter (HOSPITAL_BASED_OUTPATIENT_CLINIC_OR_DEPARTMENT_OTHER): Payer: Self-pay

## 2021-06-22 ENCOUNTER — Emergency Department (HOSPITAL_BASED_OUTPATIENT_CLINIC_OR_DEPARTMENT_OTHER)
Admission: EM | Admit: 2021-06-22 | Discharge: 2021-06-22 | Discharge status: Against Medical Advice | Payer: Medicaid Other | Attending: Emergency Medicine | Admitting: Emergency Medicine

## 2021-06-22 ENCOUNTER — Other Ambulatory Visit: Payer: Self-pay

## 2021-06-22 DIAGNOSIS — Z5321 Procedure and treatment not carried out due to patient leaving prior to being seen by health care provider: Secondary | ICD-10-CM | POA: Insufficient documentation

## 2021-06-22 DIAGNOSIS — G43909 Migraine, unspecified, not intractable, without status migrainosus: Secondary | ICD-10-CM | POA: Insufficient documentation

## 2021-06-22 NOTE — ED Notes (Signed)
Pt called 3x from waiting room, no response.

## 2021-06-22 NOTE — ED Triage Notes (Signed)
Pt sts migraine since around 5am this morning. Describes ocular pressure and a "hammer hitting temples sensation worst on left side.

## 2021-06-22 NOTE — ED Notes (Signed)
No answer for room assignment

## 2021-06-28 ENCOUNTER — Other Ambulatory Visit: Payer: Self-pay

## 2021-06-28 DIAGNOSIS — R404 Transient alteration of awareness: Secondary | ICD-10-CM

## 2021-07-16 ENCOUNTER — Ambulatory Visit: Payer: Medicaid Other | Admitting: Obstetrics and Gynecology

## 2021-08-07 ENCOUNTER — Ambulatory Visit (INDEPENDENT_AMBULATORY_CARE_PROVIDER_SITE_OTHER): Payer: Medicaid Other | Admitting: Neurology

## 2021-08-07 DIAGNOSIS — R404 Transient alteration of awareness: Secondary | ICD-10-CM | POA: Diagnosis not present

## 2021-08-08 ENCOUNTER — Ambulatory Visit: Payer: Medicaid Other | Admitting: Student

## 2021-08-08 ENCOUNTER — Encounter: Payer: Self-pay | Admitting: Student
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
## 2021-08-20 ENCOUNTER — Telehealth (INDEPENDENT_AMBULATORY_CARE_PROVIDER_SITE_OTHER): Payer: Medicaid Other | Admitting: Neurology

## 2021-08-20 ENCOUNTER — Encounter: Payer: Self-pay | Admitting: Neurology

## 2021-08-20 VITALS — Ht 63.0 in | Wt 184.0 lb

## 2021-08-20 DIAGNOSIS — R404 Transient alteration of awareness: Secondary | ICD-10-CM

## 2021-08-20 DIAGNOSIS — G43009 Migraine without aura, not intractable, without status migrainosus: Secondary | ICD-10-CM

## 2021-08-20 DIAGNOSIS — R519 Headache, unspecified: Secondary | ICD-10-CM | POA: Diagnosis not present

## 2021-08-20 MED ORDER — ZONISAMIDE 100 MG PO CAPS: ORAL_CAPSULE | ORAL | 5 refills | Status: DC

## 2021-08-20 NOTE — Patient Instructions (Signed)
Good to see you.  Start Zonisamide 100mg : Take 1 capsule every night for 2 weeks, then increase to 2 capsules every night  2. Start reducing Tylenol or any over the counter pain medication to 2-3 a week, otherwise headaches will be harder to treat  3. Keep a calendar of your headaches and the episodes of weird taste/sick stomach sensation  4. Follow-up in 3 months, call for any changes
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
## 2021-11-23 ENCOUNTER — Ambulatory Visit (INDEPENDENT_AMBULATORY_CARE_PROVIDER_SITE_OTHER): Payer: Medicaid Other | Admitting: Neurology

## 2021-11-23 ENCOUNTER — Encounter: Payer: Self-pay | Admitting: Neurology

## 2021-11-23 VITALS — BP 104/60 | HR 73 | Ht 63.0 in | Wt 187.6 lb

## 2021-11-23 DIAGNOSIS — R404 Transient alteration of awareness: Secondary | ICD-10-CM | POA: Diagnosis not present

## 2021-11-23 DIAGNOSIS — R519 Headache, unspecified: Secondary | ICD-10-CM | POA: Diagnosis not present

## 2021-11-23 DIAGNOSIS — G43009 Migraine without aura, not intractable, without status migrainosus: Secondary | ICD-10-CM

## 2021-11-23 MED ORDER — ZONISAMIDE 100 MG PO CAPS: ORAL_CAPSULE | ORAL | 3 refills | Status: DC
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
## 2022-01-14 NOTE — L&D Delivery Note (Signed)
OB/GYN Faculty Practice Delivery Note  Marissa Cross is a 32 y.o. G3P2002 s/p VBAC at [redacted]w[redacted]d. She was admitted for IOL for cholestasis and poorly controlled GDM.   ROM: 11h 48m with clear fluid GBS Status:  Negative/-- (12/20 1348) Maximum Maternal Temperature: 98.6 F    Labor Progress: Patient arrived at 0 cm dilation and was induced with foley, pitocin and AROM. She had a prolonged latent labor at 4-5 cm but at ~20h of IOL progressed to complete and had a short second stage.   Delivery Date/Time: 01/08/2023 at 1236 Delivery: Called to room and patient was complete and pushing. Head delivered in OA position. No nuchal cord present. Shoulder and body delivered in usual fashion. Infant with spontaneous cry, placed on mother's abdomen, dried and stimulated. Cord clamped x 2 after 1-minute delay, and cut by FOB. Cord blood drawn. Placenta delivered spontaneously with gentle cord traction. Fundus firm with massage and Pitocin. Labia, perineum, vagina, and cervix inspected with no lacerations.   Placenta: intact, to L&D Complications: none Lacerations: none EBL: 25 Analgesia: epidural   Infant: Baby girl  APGAR (1 MIN): 8  APGAR (5 MINS): 9  APGAR (10 MINS):    Weight: 2720 g  Venora Maples, MD/MPH Attending Family Medicine Physician, [facility] for [facility], [facility]

## 2022-03-26 ENCOUNTER — Ambulatory Visit (INDEPENDENT_AMBULATORY_CARE_PROVIDER_SITE_OTHER): Payer: Medicaid Other | Admitting: Neurology

## 2022-03-26 ENCOUNTER — Encounter: Payer: Self-pay | Admitting: Neurology

## 2022-03-26 VITALS — BP 104/70 | HR 88 | Ht 63.0 in | Wt 190.4 lb

## 2022-03-26 DIAGNOSIS — R519 Headache, unspecified: Secondary | ICD-10-CM

## 2022-03-26 DIAGNOSIS — R404 Transient alteration of awareness: Secondary | ICD-10-CM | POA: Diagnosis not present

## 2022-03-26 DIAGNOSIS — G43009 Migraine without aura, not intractable, without status migrainosus: Secondary | ICD-10-CM

## 2022-03-26 MED ORDER — SUMATRIPTAN SUCCINATE 50 MG PO TABS: ORAL_TABLET | ORAL | 6 refills | Status: DC

## 2022-03-26 MED ORDER — ZONISAMIDE 100 MG PO CAPS: ORAL_CAPSULE | ORAL | 3 refills | Status: DC
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
## 2022-03-26 NOTE — Patient Instructions (Signed)
Good to see you.  Increase Zonisamide '100mg'$ : take 3 capsules every night  2. Take sumatriptan (Imitrex) at onset of migraine. May take second dose after 2 hours if needed. Do not take more than 3 a week.  3. Start cutting down on Tylenol use. Taking Tylenol everyday can lead to more headaches (called rebound headaches). Minimize to 3 a week  4. Follow-up in 4 months, call for any changes

## 2022-07-16 ENCOUNTER — Ambulatory Visit: Payer: Medicaid Other | Admitting: Neurology

## 2022-07-16 ENCOUNTER — Encounter: Payer: Self-pay | Admitting: Neurology

## 2022-07-16 VITALS — BP 117/64 | HR 72 | Ht 63.0 in | Wt 186.6 lb

## 2022-07-16 DIAGNOSIS — G43009 Migraine without aura, not intractable, without status migrainosus: Secondary | ICD-10-CM | POA: Diagnosis not present

## 2022-07-16 DIAGNOSIS — R404 Transient alteration of awareness: Secondary | ICD-10-CM | POA: Diagnosis not present
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
## 2022-07-16 NOTE — Patient Instructions (Signed)
I hope you start feeling better soon.  Wean off the Zonisamide: take 2 capsules every night for 3 days, then 1 capsule every night for 3 days, then stop  2. Start Magnesium 400mg  daily and Vitamin B2 400mg  daily to help with headaches  3. Follow-up in 3 months, call for any changes

## 2022-07-29 LAB — OB RESULTS CONSOLE PLATELET COUNT: Platelets: 264

## 2022-07-29 LAB — OB RESULTS CONSOLE VARICELLA ZOSTER ANTIBODY, IGG: Varicella: IMMUNE

## 2022-07-29 LAB — OB RESULTS CONSOLE ANTIBODY SCREEN: Antibody Screen: NEGATIVE

## 2022-07-29 LAB — OB RESULTS CONSOLE HGB/HCT, BLOOD
HCT: 36 (ref 29–41)
Hemoglobin: 12.2

## 2022-07-29 LAB — OB RESULTS CONSOLE GC/CHLAMYDIA
Chlamydia: NEGATIVE
Neisseria Gonorrhea: NEGATIVE

## 2022-07-29 LAB — HEPATITIS C ANTIBODY: HCV Ab: NEGATIVE

## 2022-07-29 LAB — OB RESULTS CONSOLE HIV ANTIBODY (ROUTINE TESTING): HIV: NONREACTIVE

## 2022-07-29 LAB — OB RESULTS CONSOLE RUBELLA ANTIBODY, IGM: Rubella: IMMUNE

## 2022-07-29 LAB — OB RESULTS CONSOLE RPR: RPR: NONREACTIVE

## 2022-07-29 LAB — OB RESULTS CONSOLE ABO/RH: RH Type: POSITIVE

## 2022-07-29 LAB — OB RESULTS CONSOLE HEPATITIS B SURFACE ANTIGEN: Hepatitis B Surface Ag: NEGATIVE

## 2022-08-10 ENCOUNTER — Inpatient Hospital Stay (HOSPITAL_COMMUNITY)
Admission: AD | Admit: 2022-08-10 | Discharge: 2022-08-10 | Disposition: A | Discharge status: Home / Self Care | Payer: Medicaid Other | Source: Home / Self Care | Attending: Obstetrics & Gynecology | Admitting: Obstetrics & Gynecology

## 2022-08-10 ENCOUNTER — Encounter (HOSPITAL_COMMUNITY): Payer: Self-pay

## 2022-08-10 DIAGNOSIS — O26892 Other specified pregnancy related conditions, second trimester: Secondary | ICD-10-CM | POA: Diagnosis not present

## 2022-08-10 DIAGNOSIS — I951 Orthostatic hypotension: Secondary | ICD-10-CM

## 2022-08-10 DIAGNOSIS — Z3A14 14 weeks gestation of pregnancy: Secondary | ICD-10-CM | POA: Insufficient documentation

## 2022-08-10 DIAGNOSIS — R55 Syncope and collapse: Secondary | ICD-10-CM | POA: Diagnosis not present

## 2022-08-10 DIAGNOSIS — R531 Weakness: Secondary | ICD-10-CM | POA: Insufficient documentation

## 2022-08-10 DIAGNOSIS — R110 Nausea: Secondary | ICD-10-CM | POA: Diagnosis present

## 2022-08-10 DIAGNOSIS — R42 Dizziness and giddiness: Secondary | ICD-10-CM | POA: Diagnosis present

## 2022-08-10 DIAGNOSIS — R9431 Abnormal electrocardiogram [ECG] [EKG]: Secondary | ICD-10-CM | POA: Diagnosis not present

## 2022-08-10 LAB — CBC WITH DIFFERENTIAL/PLATELET
Abs Immature Granulocytes: 0.1 10*3/uL — ABNORMAL HIGH (ref 0.00–0.07)
Basophils Absolute: 0 10*3/uL (ref 0.0–0.1)
Basophils Relative: 0 %
Eosinophils Absolute: 0.1 10*3/uL (ref 0.0–0.5)
Eosinophils Relative: 1 %
HCT: 36.3 % (ref 36.0–46.0)
Hemoglobin: 12.7 g/dL (ref 12.0–15.0)
Immature Granulocytes: 1 %
Lymphocytes Relative: 15 %
Lymphs Abs: 1.1 10*3/uL (ref 0.7–4.0)
MCH: 30.4 pg (ref 26.0–34.0)
MCHC: 35 g/dL (ref 30.0–36.0)
MCV: 86.8 fL (ref 80.0–100.0)
Monocytes Absolute: 0.5 10*3/uL (ref 0.1–1.0)
Monocytes Relative: 7 %
Neutro Abs: 5.7 10*3/uL (ref 1.7–7.7)
Neutrophils Relative %: 76 %
Platelets: 209 10*3/uL (ref 150–400)
RBC: 4.18 MIL/uL (ref 3.87–5.11)
RDW: 13.6 % (ref 11.5–15.5)
WBC: 7.5 10*3/uL (ref 4.0–10.5)
nRBC: 0 % (ref 0.0–0.2)

## 2022-08-10 LAB — COMPREHENSIVE METABOLIC PANEL
ALT: 32 U/L (ref 0–44)
AST: 23 U/L (ref 15–41)
Albumin: 3.7 g/dL (ref 3.5–5.0)
Alkaline Phosphatase: 52 U/L (ref 38–126)
Anion gap: 14 (ref 5–15)
BUN: 5 mg/dL — ABNORMAL LOW (ref 6–20)
CO2: 23 mmol/L (ref 22–32)
Calcium: 9.8 mg/dL (ref 8.9–10.3)
Chloride: 101 mmol/L (ref 98–111)
Creatinine, Ser: 0.54 mg/dL (ref 0.44–1.00)
GFR, Estimated: >60 mL/min (ref >60)
Glucose, Bld: 96 mg/dL (ref 70–99)
Potassium: 3.8 mmol/L (ref 3.5–5.1)
Sodium: 138 mmol/L (ref 135–145)
Total Bilirubin: 0.6 mg/dL (ref 0.3–1.2)
Total Protein: 6.4 g/dL — ABNORMAL LOW (ref 6.5–8.1)

## 2022-08-10 LAB — URINALYSIS, ROUTINE W REFLEX MICROSCOPIC
Bilirubin Urine: NEGATIVE
Glucose, UA: NEGATIVE mg/dL
Hgb urine dipstick: NEGATIVE
Ketones, ur: NEGATIVE mg/dL
Leukocytes,Ua: NEGATIVE
Nitrite: NEGATIVE
Protein, ur: NEGATIVE mg/dL
Specific Gravity, Urine: 1.008 (ref 1.005–1.030)
pH: 7 (ref 5.0–8.0)

## 2022-08-10 MED ORDER — ONDANSETRON HCL 8 MG PO TABS: 8.0000 mg | ORAL_TABLET | Freq: Three times a day (TID) | ORAL | 1 refills | Status: AC | PRN

## 2022-08-10 MED ORDER — HYDROXYZINE PAMOATE 25 MG PO CAPS: 25.0000 mg | ORAL_CAPSULE | Freq: Three times a day (TID) | ORAL | 0 refills | Status: DC | PRN

## 2022-08-10 MED ORDER — HYDROXYZINE HCL 25 MG PO TABS
25.0000 mg | ORAL_TABLET | Freq: Once | ORAL | Status: AC
  Administered 2022-08-10: 25 mg via ORAL
  Filled 2022-08-10: qty 1

## 2022-08-10 MED ORDER — LACTATED RINGERS IV BOLUS
1000.0000 mL | Freq: Once | INTRAVENOUS | Status: AC
  Administered 2022-08-10: 1000 mL via INTRAVENOUS

## 2022-08-10 NOTE — MAU Provider Note (Addendum)
MAU Provider Note  History  [id number]  Arrival date and time: 08/10/22 1114   Chief Complaint  Patient presents with   Dizziness   Nausea     HPI Marissa Cross is a 32 y.o. G3P2002 at [redacted]w[redacted]d by  LMP with PMHx notable for seizure history, who presents for near syncopal event.  Patient was brought by EMS after having a near syncopal event at home.  Patient notes she had a yogurt with granola this morning, but later when she went to the store she started feeling "off," and then she decided to go home.  When she started preparing to make breakfast, she felt like she had to go to the bathroom during her attempt at a bowel movement, she had an overall flushing and heat minutes, over her and she felt really weak.  She immediately laid down on the floor and called her husband tried to get her closer to event she can get here.  Once EMS arrived, patient notes she felt better when she was exposed to be outside here, and was given nausea medication.  Per EMS, NSR en route.  Of note, she has a history of anxiety, depression, migraine, recurrent episodes of loss of consciousness and staring/unresponsive episode driving.  Workup with MRI and EEG largely normal.  She was on zonisamide, but was recently weaned off during pregnancy.  She is supposed to be on magnesium and riboflavin for migraine prophylaxis.  Per neurology note on 7/2, patient's symptoms have been worsening since pregnancy started.   Vaginal bleeding: No LOF: No Fetal Movement: No Contractions: No     OB History  Gravida Para Term Preterm AB Living  3 2 2  0 0 2  SAB IAB Ectopic Multiple Live Births  0 0 0   2    # Outcome Date GA Lbr Len/2nd Weight Sex Type Anes PTL Lv  3 Current           2 Term 10/15/16 [redacted]w[redacted]d  3205 g F CS-LTranv EPI  LIV  1 Term 08/26/10    Genella Mech   LIV     Past Medical History:  Diagnosis Date   Placental abruption affecting delivery 10/15/2016   Vaginal Pap smear, abnormal     Past Surgical History:   Procedure Laterality Date   CESAREAN SECTION N/A 10/15/2016   Procedure: CESAREAN SECTION;  Surgeon: Shea Evans, MD;  Location: [facility];  Service: Obstetrics;  Laterality: N/A;    History reviewed. No pertinent family history.  Social History   Socioeconomic History   Marital status: Unknown    Spouse name: Not on file   Number of children: Not on file   Years of education: Not on file   Highest education level: Not on file  Occupational History   Not on file  Tobacco Use   Smoking status: Never   Smokeless tobacco: Never  Vaping Use   Vaping status: Never Used  Substance and Sexual Activity   Alcohol use: Not Currently    Comment: holidays only; not while preg   Drug use: No   Sexual activity: Yes    Birth control/protection: None  Other Topics Concern   Not on file  Social History Narrative   ** Merged History Encounter **   Right handed     Caffeine 1 cup daily    Home is one level   Social Determinants of Corporate investment banker Strain: Not on file  Food Insecurity: Not on file  Transportation  Needs: Not on file  Physical Activity: Not on file  Stress: Not on file  Social Connections: Unknown (05/28/2022)   Received from [facility], [facility] Short Social Needs Screening - Social Connection    Would you like help with any of the following needs: food, medicine/medical supplies, transportation, loneliness, housing or utilities?: Not on file  Intimate Partner Violence: Not on file    No Known Allergies  No current facility-administered medications on file prior to encounter.   Current Outpatient Medications on File Prior to Encounter  Medication Sig Dispense Refill   prenatal vitamin w/FE, FA (PRENATAL 1 + 1) 27-1 MG TABS tablet Take 1 tablet by mouth daily at 12 noon.     pyridOXINE (VITAMIN B6) 100 MG tablet Take 100 mg by mouth daily.     dicyclomine (BENTYL) 20 MG tablet Take 1 tablet (20 mg total) by mouth 2 (two)  times daily. 20 tablet 0   norgestimate-ethinyl estradiol (ORTHO-CYCLEN) 0.25-35 MG-MCG tablet Take 1 tablet by mouth daily. (Patient not taking: Reported on 07/16/2022) 28 tablet 11   ondansetron (ZOFRAN ODT) 4 MG disintegrating tablet Take 1 tablet (4 mg total) by mouth every 8 (eight) hours as needed for nausea or vomiting. 20 tablet 0   SUMAtriptan (IMITREX) 50 MG tablet Take 1 tablet at onset of migraine. May repeat in 2 hours if headache persists or recurs. Do not take more than 3 a week 10 tablet 6   valACYclovir (VALTREX) 1000 MG tablet 2 tablets BID x 1 day for flare up cold sore 20 tablet 0    ROS: Pertinent positives and negative per HPI, all others reviewed and negative  Physical Exam   BP 109/62   Pulse 91   Temp 98.3 F (36.8 C) (Oral)   Resp 14   Wt 86.5 kg   LMP 04/29/2022   SpO2 99%   BMI 33.80 kg/m   Patient Vitals for the past 24 hrs:  BP Temp Temp src Pulse Resp SpO2 Weight  08/10/22 1312 109/62 -- -- 91 -- 99 % --  08/10/22 1125 -- -- -- -- -- -- 86.5 kg  08/10/22 1124 100/62 98.3 F (36.8 C) Oral 84 14 100 % --    Physical Exam Vitals reviewed.  Constitutional:      Appearance: Normal appearance.  HENT:     Head: Normocephalic and atraumatic.     Right Ear: External ear normal.     Left Ear: External ear normal.     Mouth/Throat:     Mouth: Mucous membranes are dry.     Pharynx: Oropharynx is clear.  Eyes:     Conjunctiva/sclera: Conjunctivae normal.  Cardiovascular:     Rate and Rhythm: Normal rate and regular rhythm.  Pulmonary:     Effort: Pulmonary effort is normal.     Breath sounds: Normal breath sounds.  Abdominal:     General: Abdomen is flat.     Palpations: Abdomen is soft.  Skin:    General: Skin is warm.     Capillary Refill: Capillary refill takes less than 2 seconds.  Psychiatric:        Attention and Perception: Attention normal.        Mood and Affect: Mood is anxious.        Speech: Speech normal.        Behavior: Behavior  is cooperative.        Thought Content: Thought content normal.     Cervical Exam  Not done   Bedside Ultrasound not done  FHT 141 bpm on Doppler  Labs Results for orders placed or performed during the hospital encounter of 08/10/22 (from the past 24 hour(s))  CBC with Differential/Platelet     Status: Abnormal   Collection Time: 08/10/22 12:11 PM  Result Value Ref Range   WBC 7.5 4.0 - 10.5 K/uL   RBC 4.18 3.87 - 5.11 MIL/uL   Hemoglobin 12.7 12.0 - 15.0 g/dL   HCT 16.1 09.6 - 04.5 %   MCV 86.8 80.0 - 100.0 fL   MCH 30.4 26.0 - 34.0 pg   MCHC 35.0 30.0 - 36.0 g/dL   RDW 40.9 81.1 - 91.4 %   Platelets 209 150 - 400 K/uL   nRBC 0.0 0.0 - 0.2 %   Neutrophils Relative % 76 %   Neutro Abs 5.7 1.7 - 7.7 K/uL   Lymphocytes Relative 15 %   Lymphs Abs 1.1 0.7 - 4.0 K/uL   Monocytes Relative 7 %   Monocytes Absolute 0.5 0.1 - 1.0 K/uL   Eosinophils Relative 1 %   Eosinophils Absolute 0.1 0.0 - 0.5 K/uL   Basophils Relative 0 %   Basophils Absolute 0.0 0.0 - 0.1 K/uL   Immature Granulocytes 1 %   Abs Immature Granulocytes 0.10 (H) 0.00 - 0.07 K/uL  Comprehensive metabolic panel     Status: Abnormal   Collection Time: 08/10/22 12:11 PM  Result Value Ref Range   Sodium 138 135 - 145 mmol/L   Potassium 3.8 3.5 - 5.1 mmol/L   Chloride 101 98 - 111 mmol/L   CO2 23 22 - 32 mmol/L   Glucose, Bld 96 70 - 99 mg/dL   BUN 5 (L) 6 - 20 mg/dL   Creatinine, Ser 7.82 0.44 - 1.00 mg/dL   Calcium 9.8 8.9 - 95.6 mg/dL   Total Protein 6.4 (L) 6.5 - 8.1 g/dL   Albumin 3.7 3.5 - 5.0 g/dL   AST 23 15 - 41 U/L   ALT 32 0 - 44 U/L   Alkaline Phosphatase 52 38 - 126 U/L   Total Bilirubin 0.6 0.3 - 1.2 mg/dL   GFR, Estimated >21 >30 mL/min   Anion gap 14 5 - 15  Urinalysis, Routine w reflex microscopic -Urine, Clean Catch     Status: Abnormal   Collection Time: 08/10/22  1:13 PM  Result Value Ref Range   Color, Urine YELLOW YELLOW   APPearance HAZY (A) CLEAR   Specific Gravity, Urine  1.008 1.005 - 1.030   pH 7.0 5.0 - 8.0   Glucose, UA NEGATIVE NEGATIVE mg/dL   Hgb urine dipstick NEGATIVE NEGATIVE   Bilirubin Urine NEGATIVE NEGATIVE   Ketones, ur NEGATIVE NEGATIVE mg/dL   Protein, ur NEGATIVE NEGATIVE mg/dL   Nitrite NEGATIVE NEGATIVE   Leukocytes,Ua NEGATIVE NEGATIVE    Imaging No results found.  MAU Course  MDM: moderate  This patient presents to the ED for concern of   Chief Complaint  Patient presents with   Dizziness   Nausea     These complains involves an extensive number of treatment options, and is a complaint that carries with it a high risk of complications and morbidity.  The differential diagnosis for  1.  Near syncopal event INCLUDES orthostatic hypotension, vasovagal event, cardiac related, electrolyte.  Co morbidities that complicate the patient evaluation:  Additional history obtained from partner  Interpreter services used: not applicable  External records from outside source obtained and reviewed including Prenatal  care records  Lab Tests: UA, CMP, and CBC  I ordered, and personally interpreted labs.  The pertinent results include:  as baove  Imaging Studies ordered: none  Cardiac Testing/Monitoring:  EKG was ordered today. I personally reviewed or consulted with a physician to help with intrepretation.   Medicines ordered and prescription drug management:  Medications:    Reevaluation of the patient after these medicines showed that the patient improved I have reviewed the patients home medicines and have made adjustments as needed  Critical Interventions: IV fluids  Consultations Obtained: n/a  MAU Course: -1201: Patient seen-dry mucous membranes, CBC, CMP, UA, EKG ordered.  Added hydroxyzine -1408: Patient is feeling improved after IV bolus and medications.  Normal CBC/CMP, UA.  EKG NSR.  Orthostatics revealed 91/53 lying, 105/63 sitting, 115/62 standing.  Patient does qualify for orthostatic hypotension.  Encouraged  and discussed importance of staying hydrated and electrolytes daily water intake.  After the interventions noted above, I reevaluated the patient and found that they have :improved  Dispostion: discharged   Assessment and Plan  1. Orthostasis  2. Vasovagal episode - Discharge patient  3. [redacted] weeks gestation of pregnancy - Discharge patient   Patient seen for issues above.  Please see MAU course for details.  Sent home with Zofran, Vistaril and encouraged to stay hydrated and electrolyte function and daily water intake given that she qualified for orthostatic hypotension.  Patient agreed with POC. Gave return and preterm labor precautions.  Follow-up with primary OB.   Discharge Instructions     Discharge patient   Complete by: As directed    Discharge disposition: 01-Home or Self Care   Discharge patient date: 08/10/2022      Allergies as of 08/10/2022   No Known Allergies      Medication List     STOP taking these medications    dicyclomine 20 MG tablet Commonly known as: BENTYL   norgestimate-ethinyl estradiol 0.25-35 MG-MCG tablet Commonly known as: ORTHO-CYCLEN   SUMAtriptan 50 MG tablet Commonly known as: IMITREX   valACYclovir 1000 MG tablet Commonly known as: VALTREX       TAKE these medications    hydrOXYzine 25 MG capsule Commonly known as: Vistaril Take 1 capsule (25 mg total) by mouth 3 (three) times daily as needed.   ondansetron 4 MG disintegrating tablet Commonly known as: Zofran ODT Take 1 tablet (4 mg total) by mouth every 8 (eight) hours as needed for nausea or vomiting.   ondansetron 8 MG tablet Commonly known as: Zofran Take 1 tablet (8 mg total) by mouth every 8 (eight) hours as needed for nausea or vomiting.   prenatal vitamin w/FE, FA 27-1 MG Tabs tablet Take 1 tablet by mouth daily at 12 noon.   pyridOXINE 100 MG tablet Commonly known as: VITAMIN B6 Take 100 mg by mouth daily.        Myrtie Hawk, DO FMOB  Fellow, Faculty practice [facility], Center for [facility] 08/10/22  2:10 PM

## 2022-08-10 NOTE — MAU Note (Addendum)
.  Marissa Cross is a 32 y.o. at [redacted]w[redacted]d here in MAU reporting: around 1000 this morning she started feeling lightheaded and hot like she was going throw up. She went to the restroom thinking she was going to throw up but then had a bowel movement instead. They gave her 4mg  of zofran on the EMS truck. States she feels better now but still very weak.   Pain score: 0 Vitals:   08/10/22 1124  BP: 100/62  Pulse: 84  Resp: 14  Temp: 98.3 F (36.8 C)  SpO2: 100%     FHT:141 Lab orders placed from triage:  UA

## 2022-08-14 ENCOUNTER — Encounter: Payer: Self-pay | Admitting: General Practice

## 2022-08-14 DIAGNOSIS — G40909 Epilepsy, unspecified, not intractable, without status epilepticus: Secondary | ICD-10-CM | POA: Insufficient documentation

## 2022-08-14 DIAGNOSIS — O0990 Supervision of high risk pregnancy, unspecified, unspecified trimester: Secondary | ICD-10-CM | POA: Insufficient documentation

## 2022-08-20 ENCOUNTER — Ambulatory Visit (INDEPENDENT_AMBULATORY_CARE_PROVIDER_SITE_OTHER): Payer: Medicaid Other | Admitting: Obstetrics & Gynecology

## 2022-08-20 ENCOUNTER — Encounter: Payer: Self-pay | Admitting: Obstetrics & Gynecology

## 2022-08-20 ENCOUNTER — Other Ambulatory Visit: Payer: Self-pay

## 2022-08-20 VITALS — BP 115/69 | HR 96 | Wt 193.4 lb

## 2022-08-20 DIAGNOSIS — O0990 Supervision of high risk pregnancy, unspecified, unspecified trimester: Secondary | ICD-10-CM

## 2022-08-20 DIAGNOSIS — O99352 Diseases of the nervous system complicating pregnancy, second trimester: Secondary | ICD-10-CM | POA: Diagnosis not present

## 2022-08-20 DIAGNOSIS — O34219 Maternal care for unspecified type scar from previous cesarean delivery: Secondary | ICD-10-CM | POA: Diagnosis not present

## 2022-08-20 DIAGNOSIS — Z3A16 16 weeks gestation of pregnancy: Secondary | ICD-10-CM | POA: Diagnosis not present

## 2022-08-20 DIAGNOSIS — G40909 Epilepsy, unspecified, not intractable, without status epilepticus: Secondary | ICD-10-CM

## 2022-08-20 DIAGNOSIS — O0992 Supervision of high risk pregnancy, unspecified, second trimester: Secondary | ICD-10-CM | POA: Diagnosis not present

## 2022-08-20 DIAGNOSIS — O3680X1 Pregnancy with inconclusive fetal viability, fetus 1: Secondary | ICD-10-CM

## 2022-08-20 NOTE — Progress Notes (Unsigned)
  Subjective:Transfer from HD.     Marissa Cross is a [id number] [redacted]w[redacted]d being seen today for her first obstetrical visit.  Her obstetrical history is significant for  migraine headache and seizure evaluation that was negative . Patient does intend to breast feed. Pregnancy history fully reviewed.  Patient reports headache.  Vitals:   08/20/22 1516  BP: 115/69  Pulse: 96  Weight: 193 lb 6.4 oz (87.7 kg)    HISTORY: OB History  Gravida Para Term Preterm AB Living  3 2 2  0 0 2  SAB IAB Ectopic Multiple Live Births  0 0 0 0 2    # Outcome Date GA Lbr Len/2nd Weight Sex Type Anes PTL Lv  3 Current           2 Term 10/15/16 [redacted]w[redacted]d  7 lb 1.1 oz (3.205 kg) F CS-LTranv EPI  LIV  1 Term 08/26/10 [redacted]w[redacted]d  7 lb 12 oz (3.515 kg) M Vag-Spont   LIV   Past Medical History:  Diagnosis Date   Placental abruption affecting delivery 10/15/2016   Seizure (HCC)    Vaginal Pap smear, abnormal    Past Surgical History:  Procedure Laterality Date   CESAREAN SECTION N/A 10/15/2016   Procedure: CESAREAN SECTION;  Surgeon: Shea Evans, MD;  Location: [facility]ING SUIT[facility];  Service: Obstetrics;  Laterality: N/A;   History reviewed. No pertinent family history.   Exam    Uterus:   20 week  Pelvic Exam: deferred                              System: Breast:  normal appearance, no masses or tenderness, deferred   Skin: normal coloration and turgor, no rashes    Neurologic: oriented, normal mood   Extremities: normal strength, tone, and muscle mass   HEENT PERRLA   Mouth/Teeth mucous membranes moist, pharynx normal without lesions   Neck supple and no masses   Cardiovascular: regular rate and rhythm   Respiratory:  appears well, vitals normal, no respiratory distress, acyanotic, normal RR   Abdomen: soft, non-tender; bowel sounds normal; no masses,  no organomegaly   Urinary:       Assessment:    Pregnancy: [id number] Patient Active Problem List   Diagnosis Date Noted   Previous cesarean  delivery, antepartum 08/20/2022   Supervision of high risk pregnancy, antepartum 08/14/2022   Seizure disorder during pregnancy (HCC) 08/14/2022   CIN I (cervical intraepithelial neoplasia I) 11/21/2014   Mild dysplasia of cervix (CIN I) 09/16/2014   Papanicolaou smear of cervix with low grade squamous intraepithelial lesion (LGSIL) 09/14/2014        Plan:     Initial labs drawn. Prenatal vitamins. Problem list reviewed and updated. Genetic Screening discussed : ordered.  Ultrasound discussed; fetal survey: requested.  Follow up in 4 weeks. 50% of 30 min visit spent on counseling and coordination of care.  She may use her headache medication if she needs it but her headaches are improving   Scheryl Darter 08/20/2022

## 2022-08-21 ENCOUNTER — Ambulatory Visit (INDEPENDENT_AMBULATORY_CARE_PROVIDER_SITE_OTHER): Payer: Medicaid Other

## 2022-08-21 DIAGNOSIS — Z3A17 17 weeks gestation of pregnancy: Secondary | ICD-10-CM | POA: Diagnosis not present

## 2022-08-21 DIAGNOSIS — O3680X1 Pregnancy with inconclusive fetal viability, fetus 1: Secondary | ICD-10-CM

## 2022-08-21 DIAGNOSIS — O0990 Supervision of high risk pregnancy, unspecified, unspecified trimester: Secondary | ICD-10-CM

## 2022-08-27 ENCOUNTER — Telehealth: Payer: Self-pay

## 2022-08-27 NOTE — Telephone Encounter (Signed)
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 Attempted to contact patient regarding the results and contact information to reach out if she had any questions or concerns. Left message via voicemail for a call back at her convenience.

## 2022-08-27 NOTE — Telephone Encounter (Signed)
Patient returned call and left VM on nurse line.

## 2022-08-28 NOTE — Telephone Encounter (Signed)
Attempted to return call to patient. She did not answer. LM for her to check her Mychart message and to call the office if she continues to have questions or concerns.  Mychart message sent to patient.

## 2022-09-06 ENCOUNTER — Inpatient Hospital Stay (HOSPITAL_COMMUNITY)
Admission: AD | Admit: 2022-09-06 | Discharge: 2022-09-06 | Disposition: A | Discharge status: Home / Self Care | Payer: Medicaid Other | Attending: Obstetrics & Gynecology | Admitting: Obstetrics & Gynecology

## 2022-09-06 DIAGNOSIS — Z3A18 18 weeks gestation of pregnancy: Secondary | ICD-10-CM | POA: Insufficient documentation

## 2022-09-06 DIAGNOSIS — R519 Headache, unspecified: Secondary | ICD-10-CM | POA: Insufficient documentation

## 2022-09-06 DIAGNOSIS — O3412 Maternal care for benign tumor of corpus uteri, second trimester: Secondary | ICD-10-CM | POA: Diagnosis not present

## 2022-09-06 DIAGNOSIS — O34219 Maternal care for unspecified type scar from previous cesarean delivery: Secondary | ICD-10-CM | POA: Diagnosis not present

## 2022-09-06 DIAGNOSIS — O368390 Maternal care for abnormalities of the fetal heart rate or rhythm, unspecified trimester, not applicable or unspecified: Secondary | ICD-10-CM

## 2022-09-06 DIAGNOSIS — O26892 Other specified pregnancy related conditions, second trimester: Secondary | ICD-10-CM | POA: Diagnosis present

## 2022-09-06 DIAGNOSIS — O26899 Other specified pregnancy related conditions, unspecified trimester: Secondary | ICD-10-CM

## 2022-09-06 DIAGNOSIS — O0990 Supervision of high risk pregnancy, unspecified, unspecified trimester: Secondary | ICD-10-CM

## 2022-09-06 LAB — URINALYSIS, ROUTINE W REFLEX MICROSCOPIC
Bilirubin Urine: NEGATIVE
Glucose, UA: NEGATIVE mg/dL
Hgb urine dipstick: NEGATIVE
Ketones, ur: NEGATIVE mg/dL
Leukocytes,Ua: NEGATIVE
Nitrite: NEGATIVE
Protein, ur: NEGATIVE mg/dL
Specific Gravity, Urine: 1.016 (ref 1.005–1.030)
pH: 6 (ref 5.0–8.0)

## 2022-09-06 MED ORDER — ACETAMINOPHEN-CAFFEINE 500-65 MG PO TABS
2.0000 | ORAL_TABLET | Freq: Once | ORAL | Status: AC
  Administered 2022-09-06: 2 via ORAL
  Filled 2022-09-06: qty 2

## 2022-09-06 NOTE — MAU Note (Signed)
Pt says she has H/A - all day - started 0700. Has taken ASA- 0830 and 4 pm  8/10 Also feels cramping on right side and groin - started at 3pm (7/10) [facility]- clinic

## 2022-09-06 NOTE — MAU Provider Note (Signed)
History     CSN: [id number]  Arrival date and time: 09/06/22 1941   Event Date/Time   First Provider Initiated Contact with Patient 09/06/22 2043      Chief Complaint  Patient presents with   Abdominal Pain   Headache    Marissa Cross is a 32 y.o. G3P2002 at [redacted]w[redacted]d who receives care at [facility].  She presents today for headache and abdominal pain.  She states her headache has been ongoing throughout the day.  She states she took aspirin and a nap, but without relief.  She reports it is throbbing in the back of her head and in her right temple.  She reports it has no worsening symptoms.  She rates the headache a 8/10. She reports some right side abdominal pain that feels like pressure and cramping.  She reports it increases with rolling over.  She states it is mild and can be on either side, but appears worse today. Patient states she was told she had fibroids in her [place] this month.   OB History     Gravida  3   Para  2   Term  2   Preterm  0   AB  0   Living  2      SAB  0   IAB  0   Ectopic  0   Multiple  0   Live Births  2           Past Medical History:  Diagnosis Date   Placental abruption affecting delivery 10/15/2016   Seizure [facility])    Vaginal Pap smear, abnormal     Past Surgical History:  Procedure Laterality Date   CESAREAN SECTION N/A 10/15/2016   Procedure: CESAREAN SECTION;  Surgeon: Shea Evans, MD;  Location: [facility] SU[facility];  Service: Obstetrics;  Laterality: N/A;    No family history on file.  Social History   Tobacco Use   Smoking status: Never   Smokeless tobacco: Never  Vaping Use   Vaping status: Never Used  Substance Use Topics   Alcohol use: Not Currently    Comment: holidays only; not while preg   Drug use: No    Allergies: No Known Allergies  Medications Prior to Admission  Medication Sig Dispense Refill Last Dose   hydrOXYzine (VISTARIL) 25 MG capsule Take 1 capsule (25 mg total) by mouth 3 (three) times daily  as needed. 30 capsule 0    ondansetron (ZOFRAN ODT) 4 MG disintegrating tablet Take 1 tablet (4 mg total) by mouth every 8 (eight) hours as needed for nausea or vomiting. (Patient not taking: Reported on 08/20/2022) 20 tablet 0    ondansetron (ZOFRAN) 8 MG tablet Take 1 tablet (8 mg total) by mouth every 8 (eight) hours as needed for nausea or vomiting. 30 tablet 1    prenatal vitamin w/FE, FA (PRENATAL 1 + 1) 27-1 MG TABS tablet Take 1 tablet by mouth daily at 12 noon.      pyridOXINE (VITAMIN B6) 100 MG tablet Take 100 mg by mouth daily.       Review of Systems  Gastrointestinal:  Positive for abdominal pain. Negative for nausea and vomiting.  Genitourinary:  Negative for difficulty urinating, dysuria, vaginal bleeding and vaginal discharge.   Physical Exam   Blood pressure 108/69, pulse 99, temperature 98.7 F (37.1 C), temperature source Oral, resp. rate 16, height 5\' 3"  (1.6 m), weight 89.4 kg, last menstrual period 04/29/2022.  Physical Exam Vitals reviewed.  Constitutional:  Appearance: Normal appearance. She is well-developed.  HENT:     Head: Normocephalic and atraumatic.  Eyes:     Conjunctiva/sclera: Conjunctivae normal.  Cardiovascular:     Rate and Rhythm: Normal rate and regular rhythm.  Pulmonary:     Effort: Pulmonary effort is normal. No respiratory distress.  Abdominal:     Palpations: Abdomen is soft.     Tenderness: There is abdominal tenderness in the right lower quadrant, suprapubic area and left lower quadrant.  Musculoskeletal:        General: Normal range of motion.     Cervical back: Normal range of motion.  Skin:    General: Skin is warm.  Neurological:     Mental Status: She is alert and oriented to person, place, and time.  Psychiatric:        Mood and Affect: Mood normal.        Behavior: Behavior normal.     MAU Course  Procedures Results for orders placed or performed during the hospital encounter of 09/06/22 (from the past 24 hour(s))   Urinalysis, Routine w reflex microscopic -Urine, Clean Catch     Status: None   Collection Time: 09/06/22  9:27 PM  Result Value Ref Range   Color, Urine YELLOW YELLOW   APPearance CLEAR CLEAR   Specific Gravity, Urine 1.016 1.005 - 1.030   pH 6.0 5.0 - 8.0   Glucose, UA NEGATIVE NEGATIVE mg/dL   Hgb urine dipstick NEGATIVE NEGATIVE   Bilirubin Urine NEGATIVE NEGATIVE   Ketones, ur NEGATIVE NEGATIVE mg/dL   Protein, ur NEGATIVE NEGATIVE mg/dL   Nitrite NEGATIVE NEGATIVE   Leukocytes,Ua NEGATIVE NEGATIVE    Patient informed that the ultrasound is considered a limited OB ultrasound and is not intended to be a complete ultrasound exam.  Patient also informed that the ultrasound is not being completed with the intent of assessing for fetal or placental anomalies or any pelvic abnormalities.  Explained that the purpose of today's ultrasound is to assess for   FHR .  Patient acknowledges the purpose of the exam and the limitations of the study.  SIUP with good movement noted FHR 142    MDM Labs: UA Pain Medication BSUS Assessment and Plan  32 year old, G3P2002  SIUP at 18.4 weeks Round Ligament Pain Headache  -Reviewed POC with patient. -Exam performed and findings discussed.  -BSUS performed. -Discussed pain and management. Reassured that normal and US reveals fetus favoring right lower side. Reviewed how this maybe contributing to pain. -Discussed applying heat to area and patient agreeable.  -Further informed that bASA is not to be utilized for pain as it is taken daily to reduce risk of PreE in pregnancy.  -Patient informed that provider does not assess uterus for fibroids, but that if they are present it will be noted in anatomy US.  -Patient also questions if she will be able to have a TOLAC.  Informed that this is possible and MD will discuss further at appt. -Patient offered and agreeable to excedrin for HA. -Monitor and reassess.   Marissa Cross 09/06/2022, 8:43 PM    Reassessment (10:39 PM) -Patient reports improvement with heat application and Excedrin. -Discussed management at home. Encouraged rest.  -Precautions reviewed. -Discharged to home in stable condition.   Marissa Robins MSN, CNM Advanced Practice Provider, Center for [employer]

## 2022-09-10 ENCOUNTER — Other Ambulatory Visit: Payer: Self-pay

## 2022-09-10 ENCOUNTER — Encounter (HOSPITAL_COMMUNITY): Payer: Self-pay | Admitting: Obstetrics & Gynecology

## 2022-09-10 ENCOUNTER — Telehealth: Payer: Self-pay | Admitting: Neurology

## 2022-09-10 ENCOUNTER — Inpatient Hospital Stay (HOSPITAL_COMMUNITY)
Admission: AD | Admit: 2022-09-10 | Discharge: 2022-09-10 | Disposition: A | Discharge status: Home / Self Care | Payer: Medicaid Other | Attending: Obstetrics & Gynecology | Admitting: Obstetrics & Gynecology

## 2022-09-10 DIAGNOSIS — R519 Headache, unspecified: Secondary | ICD-10-CM

## 2022-09-10 DIAGNOSIS — O26893 Other specified pregnancy related conditions, third trimester: Secondary | ICD-10-CM

## 2022-09-10 DIAGNOSIS — O26892 Other specified pregnancy related conditions, second trimester: Secondary | ICD-10-CM | POA: Insufficient documentation

## 2022-09-10 DIAGNOSIS — O99352 Diseases of the nervous system complicating pregnancy, second trimester: Secondary | ICD-10-CM | POA: Insufficient documentation

## 2022-09-10 DIAGNOSIS — G43909 Migraine, unspecified, not intractable, without status migrainosus: Secondary | ICD-10-CM | POA: Insufficient documentation

## 2022-09-10 DIAGNOSIS — R109 Unspecified abdominal pain: Secondary | ICD-10-CM | POA: Diagnosis not present

## 2022-09-10 DIAGNOSIS — Z3A19 19 weeks gestation of pregnancy: Secondary | ICD-10-CM | POA: Insufficient documentation

## 2022-09-10 LAB — URINALYSIS, ROUTINE W REFLEX MICROSCOPIC
Bilirubin Urine: NEGATIVE
Glucose, UA: NEGATIVE mg/dL
Hgb urine dipstick: NEGATIVE
Ketones, ur: NEGATIVE mg/dL
Leukocytes,Ua: NEGATIVE
Nitrite: NEGATIVE
Protein, ur: NEGATIVE mg/dL
Specific Gravity, Urine: 1.012 (ref 1.005–1.030)
pH: 7 (ref 5.0–8.0)

## 2022-09-10 MED ORDER — LACTATED RINGERS IV SOLN
Freq: Once | INTRAVENOUS | Status: AC
  Filled 2022-09-10: qty 1000

## 2022-09-10 MED ORDER — PROMETHAZINE HCL 25 MG/ML IJ SOLN
25.0000 mg | Freq: Once | INTRAVENOUS | Status: AC
  Administered 2022-09-10: 25 mg via INTRAVENOUS
  Filled 2022-09-10: qty 1

## 2022-09-10 MED ORDER — ZONISAMIDE 100 MG PO CAPS: 100.0000 mg | ORAL_CAPSULE | Freq: Every day | ORAL | 0 refills | Status: DC

## 2022-09-10 MED ORDER — DIPHENHYDRAMINE HCL 50 MG/ML IJ SOLN
25.0000 mg | INTRAMUSCULAR | Status: AC
  Administered 2022-09-10: 25 mg via INTRAVENOUS
  Filled 2022-09-10: qty 1

## 2022-09-10 NOTE — Telephone Encounter (Signed)
Ok to restart Zonisamide 100mg : we can try taking the lowest dose (1 cap every night), but she can increase to 2 caps at bedtime if needed. Pls send in Rx, thanks

## 2022-09-10 NOTE — Addendum Note (Signed)
Addended by: Dimas Chyle on: 09/10/2022 02:47 PM   Modules accepted: Orders

## 2022-09-10 NOTE — Telephone Encounter (Signed)
Patient is having on going symptoms, Patient is wanting to restart her medication . Please give her a call back

## 2022-09-10 NOTE — MAU Provider Note (Signed)
History     CSN: [id number]  Arrival date and time: 09/10/22 1313   Event Date/Time   First Provider Initiated Contact with Patient 09/10/22 1537      Chief Complaint  Patient presents with   Abdominal Pain   Headache   HPI Marissa Cross is a 32 y.o. year old G60P2002 female at [redacted]w[redacted]d weeks gestation who presents to MAU via EMS reporting on-going headache x 4 days (since 09/06/2022). She was told by her neurologist to stop her seizure medications 2 months ago. She had been taking the medication for 1 year. She describes the H/A as "sharp, stabbing pain." She reports she was contacted by her neurologist today and was instructed to restart her Zonisamide (Rx was sent in today per Epic) tonight. She also complains of continued RT side abdominal pain that she had this weekend when she was in MAU. She states, "They told me it's RLP and 2 fibroids, but it's not any better." She receives [facility] with [facility]; next appt is 09/25/2022.   OB History     Gravida  3   Para  2   Term  2   Preterm  0   AB  0   Living  2      SAB  0   IAB  0   Ectopic  0   Multiple  0   Live Births  2           Past Medical History:  Diagnosis Date   Placental abruption affecting delivery 10/15/2016   Seizure [facility])    Vaginal Pap smear, abnormal     Past Surgical History:  Procedure Laterality Date   CESAREAN SECTION N/A 10/15/2016   Procedure: CESAREAN SECTION;  Surgeon: Shea Evans, MD;  Location: [facility] BIRTHING SUITES;  Service: Obstetrics;  Laterality: N/A;    History reviewed. No pertinent family history.  Social History   Tobacco Use   Smoking status: Never   Smokeless tobacco: Never  Vaping Use   Vaping status: Never Used  Substance Use Topics   Alcohol use: Not Currently    Comment: holidays only; not while preg   Drug use: No    Allergies: No Known Allergies  Medications Prior to Admission  Medication Sig Dispense Refill Last Dose   ondansetron (ZOFRAN ODT) 4 MG  disintegrating tablet Take 1 tablet (4 mg total) by mouth every 8 (eight) hours as needed for nausea or vomiting. 20 tablet 0 Past Week   prenatal vitamin w/FE, FA (PRENATAL 1 + 1) 27-1 MG TABS tablet Take 1 tablet by mouth daily at 12 noon.   09/10/2022   pyridOXINE (VITAMIN B6) 100 MG tablet Take 100 mg by mouth daily.   09/10/2022   hydrOXYzine (VISTARIL) 25 MG capsule Take 1 capsule (25 mg total) by mouth 3 (three) times daily as needed. 30 capsule 0     Review of Systems  Constitutional: Negative.   HENT: Negative.    Eyes: Negative.   Respiratory: Negative.    Cardiovascular: Negative.   Gastrointestinal: Negative.   Endocrine: Negative.   Genitourinary: Negative.   Musculoskeletal: Negative.   Skin: Negative.   Allergic/Immunologic: Negative.   Neurological:  Positive for headaches.  Hematological: Negative.   Psychiatric/Behavioral: Negative.     Physical Exam   Blood pressure (!) 114/58, pulse (!) 103, temperature 98.4 F (36.9 C), temperature source Oral, resp. rate 16, last menstrual period 04/29/2022.  Physical Exam Vitals and nursing note reviewed.  Constitutional:  Appearance: Normal appearance. She is obese.  HENT:     Head: Normocephalic and atraumatic.  Cardiovascular:     Rate and Rhythm: Tachycardia present.  Pulmonary:     Effort: Pulmonary effort is normal.  Abdominal:     Palpations: Abdomen is soft.  Genitourinary:    Comments: deferred Musculoskeletal:        General: Normal range of motion.  Skin:    General: Skin is warm and dry.  Neurological:     Mental Status: She is alert and oriented to person, place, and time.  Psychiatric:        Mood and Affect: Mood normal.        Behavior: Behavior normal.        Thought Content: Thought content normal.        Judgment: Judgment normal.   FHTs by doppler: 155 bpm   Reassessment @ 1740: Patient asleep. Easily aroused to report that her nausea and H/A have improved.  MAU Course   Procedures  MDM CCUA IVFs: Phenergan 25 mg in LR 1000 ml @ 999 ml/hr  -- no nausea/vomiting MVI 1 ampule in LR 1000 ml @ 999 ml/hr Benadryl 25 mg IVPB PO Challenge -- patient tolerated well  Results for orders placed or performed during the hospital encounter of 09/10/22 (from the past 24 hour(s))  Urinalysis, Routine w reflex microscopic -Urine, Clean Catch     Status: Abnormal   Collection Time: 09/10/22  2:23 PM  Result Value Ref Range   Color, Urine YELLOW YELLOW   APPearance HAZY (A) CLEAR   Specific Gravity, Urine 1.012 1.005 - 1.030   pH 7.0 5.0 - 8.0   Glucose, UA NEGATIVE NEGATIVE mg/dL   Hgb urine dipstick NEGATIVE NEGATIVE   Bilirubin Urine NEGATIVE NEGATIVE   Ketones, ur NEGATIVE NEGATIVE mg/dL   Protein, ur NEGATIVE NEGATIVE mg/dL   Nitrite NEGATIVE NEGATIVE   Leukocytes,Ua NEGATIVE NEGATIVE    Assessment and Plan  1. Headache in pregnancy, antepartum, third trimester - Information provided on form to record H/A and chronic migraine H/A - Restart Zonisamide 100 mg po hs (may increase to 2 tabs hs) as advised by neurologist   2. [redacted] weeks gestation of pregnancy   - Discharge patient - Keep scheduled appt with [facility] on 09/25/2022 - Patient verbalized an understanding of the plan of care and agrees.   Marissa Cross, CNM 09/10/2022, 3:37 PM

## 2022-09-10 NOTE — Telephone Encounter (Signed)
Pt called an informed that Ok to restart Zonisamide 100mg : we can try taking the lowest dose (1 cap every night), but she can increase to 2 caps at bedtime if needed.

## 2022-09-10 NOTE — MAU Note (Signed)
Stopped seizure medication approx 2 months ago. Has had this headache since 8/23, has tried tylenol. Had been on seizure medication for about a year.

## 2022-09-24 ENCOUNTER — Ambulatory Visit (INDEPENDENT_AMBULATORY_CARE_PROVIDER_SITE_OTHER): Payer: Medicaid Other | Admitting: Family Medicine

## 2022-09-24 ENCOUNTER — Other Ambulatory Visit: Payer: Self-pay

## 2022-09-24 VITALS — BP 105/65 | HR 95 | Wt 196.6 lb

## 2022-09-24 DIAGNOSIS — O0992 Supervision of high risk pregnancy, unspecified, second trimester: Secondary | ICD-10-CM

## 2022-09-24 DIAGNOSIS — G40909 Epilepsy, unspecified, not intractable, without status epilepticus: Secondary | ICD-10-CM

## 2022-09-24 DIAGNOSIS — G44229 Chronic tension-type headache, not intractable: Secondary | ICD-10-CM

## 2022-09-24 DIAGNOSIS — O219 Vomiting of pregnancy, unspecified: Secondary | ICD-10-CM

## 2022-09-24 DIAGNOSIS — G5603 Carpal tunnel syndrome, bilateral upper limbs: Secondary | ICD-10-CM

## 2022-09-24 DIAGNOSIS — O34219 Maternal care for unspecified type scar from previous cesarean delivery: Secondary | ICD-10-CM

## 2022-09-24 DIAGNOSIS — O0990 Supervision of high risk pregnancy, unspecified, unspecified trimester: Secondary | ICD-10-CM

## 2022-09-24 DIAGNOSIS — Z3A21 21 weeks gestation of pregnancy: Secondary | ICD-10-CM

## 2022-09-24 DIAGNOSIS — F419 Anxiety disorder, unspecified: Secondary | ICD-10-CM

## 2022-09-24 DIAGNOSIS — O99352 Diseases of the nervous system complicating pregnancy, second trimester: Secondary | ICD-10-CM

## 2022-09-24 MED ORDER — TRANSDERM-SCOP 1 MG/3DAYS TD PT72
1.0000 | MEDICATED_PATCH | TRANSDERMAL | 12 refills | Status: DC
Start: 2022-09-24 — End: 2022-10-23

## 2022-09-24 MED ORDER — PRENATAL PLUS 27-1 MG PO TABS
1.0000 | ORAL_TABLET | Freq: Every day | ORAL | 3 refills | Status: DC
Start: 2022-09-24 — End: 2022-09-25

## 2022-09-24 MED ORDER — CYCLOBENZAPRINE HCL 10 MG PO TABS
10.0000 mg | ORAL_TABLET | Freq: Three times a day (TID) | ORAL | 1 refills | Status: DC | PRN
Start: 2022-09-24 — End: 2023-01-10

## 2022-09-24 NOTE — Progress Notes (Signed)
   PRENATAL VISIT NOTE  Subjective:  Marissa Cross is a 32 y.o. G3P2002 at [redacted]w[redacted]d being seen today for ongoing prenatal care.  She is currently monitored for the following issues for this high-risk pregnancy and has Mild dysplasia of cervix (CIN I); Supervision of high risk pregnancy, antepartum; Seizure disorder during pregnancy [facility]); and Previous cesarean delivery, antepartum on their problem list.  Patient reports headache, nausea, vomiting, and anxiety, carpal tunnel .  Contractions: Not present. Vag. Bleeding: None.  Movement: Present. Denies leaking of fluid.   The following portions of the patient's history were reviewed and updated as appropriate: allergies, current medications, past family history, past medical history, past social history, past surgical history and problem list.   Objective:   Vitals:   09/24/22 1334  BP: 105/65  Pulse: 95  Weight: 196 lb 9.6 oz (89.2 kg)    Fetal Status: Fetal Heart Rate (bpm): 150   Movement: Present     General:  Alert, oriented and cooperative. Patient is in no acute distress.  Skin: Skin is warm and dry. No rash noted.   Cardiovascular: Normal heart rate noted  Respiratory: Normal respiratory effort, no problems with respiration noted  Abdomen: Soft, gravid, appropriate for gestational age.  Pain/Pressure: Absent     Pelvic: Cervical exam deferred        Extremities: Normal range of motion.  Edema: None  Mental Status: Normal mood and affect. Normal behavior. Normal judgment and thought content.   Assessment and Plan:  Pregnancy: G3P2002 at [redacted]w[redacted]d 1. Seizure disorder during pregnancy in second trimester [facility]) On meds - to f/u with neurology  2. Previous cesarean delivery, antepartum Desires TOLAC, will need consent later  3. Supervision of high risk pregnancy, antepartum Needs anatomy u/s - prenatal vitamin w/FE, FA (PRENATAL 1 + 1) 27-1 MG TABS tablet; Take 1 tablet by mouth daily at 12 noon.  Dispense: 90 tablet; Refill: 3 - [place]  MFM OB DETAIL +14 WK; Future  4. Nausea/vomiting in pregnancy Sea bands, add scopolamine as the zofran has side fx of constipation Small meals frequently - scopolamine (TRANSDERM-SCOP) 1 MG/3DAYS; Place 1 patch (1.5 mg total) onto the skin every 3 (three) days.  Dispense: 10 patch; Refill: 12  5. Chronic tension-type headache, not intractable Trial of Flexeril - cyclobenzaprine (FLEXERIL) 10 MG tablet; Take 1 tablet (10 mg total) by mouth every 8 (eight) hours as needed for muscle spasms.  Dispense: 30 tablet; Refill: 1  6. Anxiety Suffering, discussed meds, mindfulness, therapy Prefers to start with therapy Wants to withdraw from her masters program, letter written - Ambulatory referral to Integrated Behavioral Health   7. Bilateral carpal tunnel syndrome Cock-up wrist splints  8. [redacted] weeks gestation of pregnancy   Preterm labor symptoms and general obstetric precautions including but not limited to vaginal bleeding, contractions, leaking of fluid and fetal movement were reviewed in detail with the patient. Please refer to After Visit Summary for other counseling recommendations.   Return in 4 weeks (on 10/22/2022) for needs to see Asher Muir.  Future Appointments  Date Time Provider Department Center  10/10/2022  8:15 AM [facility] NURSE [facility]  10/10/2022  8:30 AM [facility] US6 [facility]  10/14/2022  1:00 PM Van Clines, MD [facility] None  10/22/2022  4:00 PM Van Clines, MD [facility] None    Marissa Bores, MD

## 2022-09-25 ENCOUNTER — Other Ambulatory Visit: Payer: Self-pay

## 2022-09-25 ENCOUNTER — Encounter: Payer: Self-pay | Admitting: Family Medicine

## 2022-09-25 ENCOUNTER — Encounter: Payer: Medicaid Other | Admitting: Family Medicine

## 2022-09-25 DIAGNOSIS — O0990 Supervision of high risk pregnancy, unspecified, unspecified trimester: Secondary | ICD-10-CM

## 2022-09-25 MED ORDER — PRENATAL PLUS 27-1 MG PO TABS: 1.0000 | ORAL_TABLET | Freq: Every day | ORAL | 3 refills | Status: DC

## 2022-09-26 NOTE — BH Specialist Note (Signed)
Integrated Behavioral Health via Telemedicine Visit  10/09/2022 IZABELLA HANSEN [id number]  Number of Integrated Behavioral Health Clinician visits: 1- Initial Visit  Session Start time: 0915   Session End time: 1018  Total time in minutes: 63   Referring Provider: Tinnie Gens, MD Patient/Family location: Home [facility] Provider location: [facility]  All persons participating in visit: Patient Suzann Borrello and [facility] Rileyann Florance   Types of Service: Individual psychotherapy and Video visit  I connected with Brynleigh Show and/or Catheryn Narducci's  n/a  via  Telephone or Video Enabled Telemedicine Application  (Video is Caregility application) and verified that I am speaking with the correct person using two identifiers. Discussed confidentiality: Yes   I discussed the limitations of telemedicine and the availability of in person appointments.  Discussed there is a possibility of technology failure and discussed alternative modes of communication if that failure occurs.  I discussed that engaging in this telemedicine visit, they consent to the provision of behavioral healthcare and the services will be billed under their insurance.  Patient and/or legal guardian expressed understanding and consented to Telemedicine visit: Yes   Presenting Concerns: Patient and/or family reports the following symptoms/concerns: Anxiety, worry, panic; "roller coaster of emotions" with surprise pregnancy, after starting master's program and children starting a new school after home schooling; pt is coping with good support from her husband and sister. Pt has anxiety medication to take as needed; prefers to use self-coping strategies; open to implementing strategies today.  Duration of problem: Ongoing anxiety since childhood; increase in current pregnancy; Severity of problem: moderate  Patient and/or Family's Strengths/Protective Factors: Social connections,  Concrete supports in place (healthy food, safe environments, etc.), and Sense of purpose  Goals Addressed: Patient will:  Reduce symptoms of: anxiety and stress   Increase knowledge and/or ability of: self-management skills and stress reduction   Demonstrate ability to: Increase healthy adjustment to current life circumstances  Progress towards Goals: Ongoing  Interventions: Interventions utilized:  Mindfulness or Management consultant, CBT Cognitive Behavioral Therapy, and Psychoeducation and/or Health Education Standardized Assessments completed: Not Needed  Patient and/or Family Response: Patient agrees with treatment plan.    Assessment: Patient currently experiencing Generalized anxiety disorder.   Patient may benefit from psychoeducation and brief therapeutic interventions regarding coping with symptoms of anxiety and current life stress .  Plan: Follow up with behavioral health clinician on : Two weeks Behavioral recommendations:  -Continue taking prenatal vitamin daily as prescribed; any other medications prescribed by medical providers -CALM relaxation breathing exercise twice daily (morning; at bedtime with sleep sounds); as needed throughout the day. -Begin Worry Time strategy, as discussed. Start by setting up start and end time reminders on phone today; continue daily for two weeks. -Read through information on After Visit Summary; use as needed Referral(s): Integrated [employer] (In Clinic)  I discussed the assessment and treatment plan with the patient and/or parent/guardian. They were provided an opportunity to ask questions and all were answered. They agreed with the plan and demonstrated an understanding of the instructions.   They were advised to call back or seek an in-person evaluation if the symptoms worsen or if the condition fails to improve as anticipated.  Rae Lips, LCSW     08/20/2022    3:48 PM  Depression screen PHQ 2/9   Decreased Interest 1  Down, Depressed, Hopeless 1  PHQ - 2 Score 2  Altered sleeping 0  Tired, decreased energy 1  Change in appetite 0  Feeling bad or failure about yourself  0  Trouble concentrating 1  Moving slowly or fidgety/restless 1  PHQ-9 Score 5      08/20/2022    3:49 PM  GAD 7 : Generalized Anxiety Score  Nervous, Anxious, on Edge 3  Control/stop worrying 2  Worry too much - different things 2  Trouble relaxing 1  Restless 1  Easily annoyed or irritable 1  Afraid - awful might happen 1  Total GAD 7 Score 11

## 2022-09-30 ENCOUNTER — Encounter: Payer: Medicaid Other | Admitting: Obstetrics & Gynecology

## 2022-09-30 ENCOUNTER — Encounter: Payer: Medicaid Other | Admitting: Obstetrics and Gynecology

## 2022-10-09 ENCOUNTER — Ambulatory Visit (INDEPENDENT_AMBULATORY_CARE_PROVIDER_SITE_OTHER): Payer: Medicaid Other | Admitting: Clinical

## 2022-10-09 DIAGNOSIS — F411 Generalized anxiety disorder: Secondary | ICD-10-CM

## 2022-10-09 NOTE — Patient Instructions (Signed)
Center for [facility] at [facility] [address] [place], [place] [address] [phone] (main office) [phone] [facility] office)  [email or web address]       BRAINSTORMING  Develop a Plan Goals: Provide a way to start conversation about your new life with a baby Assist parents in recognizing and using resources within their reach Help pave the way before birth for an easier period of transition afterwards.  Make a list of the following information to keep in a central location: Full name of Mom and Partner: _____________________________________________ Baby's full name and Date of Birth: ___________________________________________ Home Address: ___________________________________________________________ ________________________________________________________________________ Home Phone: ____________________________________________________________ Parents' cell numbers: _____________________________________________________ ________________________________________________________________________ Name and contact info for OB: ______________________________________________ Name and contact info for Pediatrician:________________________________________ Contact info for Lactation Consultants: ________________________________________  REST and SLEEP *You each need at least 4-5 hours of uninterrupted sleep every day. Write specific names and contact information.* How are you going to rest in the postpartum period? While partner's home? When partner returns to work? When you both return to work? Where will your baby sleep? Who is available to help during the day? Evening? Night? Who could move in for a period to help support you? What are some ideas to help you get enough  sleep? __________________________________________________________________________________________________________________________________________________________________________________________________________________________________________ NUTRITIOUS FOOD AND DRINK *Plan for meals before your baby is born so you can have healthy food to eat during the immediate postpartum period.* Who will look after breakfast? Lunch? Dinner? List names and contact information. Brainstorm quick, healthy ideas for each meal. What can you do before baby is born to prepare meals for the postpartum period? How can others help you with meals? Which grocery stores provide online shopping and delivery? Which restaurants offer take-out or delivery options? ______________________________________________________________________________________________________________________________________________________________________________________________________________________________________________________________________________________________________________________________________________________________________________________________________  CARE FOR MOM *It's important that mom is cared for and pampered in the postpartum period. Remember, the most important ways new mothers need care are: sleep, nutrition, gentle exercise, and time off.* Who can come take care of mom during this period? Make a list of people with their contact information. List some activities that make you feel cared for, rested, and energized? Who can make sure you have opportunities to do these things? Does mom have a space of her very own within your home that's just for her? Make a "[facility]" where she can be comfortable, rest, and renew herself  daily. ______________________________________________________________________________________________________________________________________________________________________________________________________________________________________________________________________________________________________________________________________________________________________________________________________    CARE FOR AND FEEDING BABY *Knowledgeable and encouraging people will offer the best support with regard to feeding your baby.* Educate yourself and choose the best feeding option for your baby. Make a list of people who will guide, support, and be a resource for you as your care for and feed your baby. (Friends that have breastfed or are currently breastfeeding, lactation consultants, breastfeeding support groups, etc.) Consider a postpartum doula. (These websites can give you information: dona.org & https://shea.org/) Seek out local breastfeeding resources like the breastfeeding support group at [employer] or [employer]. ______________________________________________________________________________________________________________________________________________________________________________________________________________________________________________________________________________________________________________________________________________________________________________________________________  Judson Roch AND ERRANDS Who can help with a thorough cleaning before baby is born? Make a list of people who will help with housekeeping and chores, like laundry, light cleaning, dishes, bathrooms, etc. Who can run some errands for you? What can you do to make sure you are stocked with basic supplies before baby is born? Who is going to do the  shopping? ______________________________________________________________________________________________________________________________________________________________________________________________________________________________________________________________________________________________________________________________________________________________________________________________________     Family Adjustment *Nurture yourselves.it helps parents be more loving and allows for better bonding with their child.* What sorts of things do  you and partner enjoy doing together? Which activities help you to connect and strengthen your relationship? Make a list of those things. Make a list of people whom you trust to care for your baby so you can have some time together as a couple. What types of things help partner feel connected to Mom? Make a list. What needs will partner have in order to bond with baby? Other children? Who will care for them when you go into labor and while you are in the hospital? Think about what the needs of your older children might be. Who can help you meet those needs? In what ways are you helping them prepare for bringing baby home? List some specific strategies you have for family adjustment. _______________________________________________________________________________________________________________________________________________________________________________________________________________________________________________________________________________________________________________________________________________  SUPPORT *Someone who can empathize with experiences normalizes your problems and makes them more bearable.* Make a list of other friends, neighbors, and/or co-workers you know with infants (and small children, if applicable) with whom you can connect. Make a list of local or online support groups, mom groups, etc. in which you can be  involved. ______________________________________________________________________________________________________________________________________________________________________________________________________________________________________________________________________________________________________________________________________________________________________________________________________  Childcare Plans Investigate and plan for childcare if mom is returning to work. Talk about mom's concerns about her transition back to work. Talk about partner's concerns regarding this transition.  Mental Health *Your mental health is one of the highest priorities for a pregnant or postpartum mom.* 1 in 5 women experience anxiety and/or depression from the time of conception through the first year after birth. Postpartum Mood Disorders are the #1 complication of pregnancy and childbirth and the suffering experienced by these mothers is not necessary! These illnesses are temporary and respond well to treatment, which often includes self-care, social support, talk therapy, and medication when needed. Women experiencing anxiety and depression often say things like: "I'm supposed to be happy.why do I feel so sad?", "Why can't I snap out of it?", "I'm having thoughts that scare me." There is no need to be embarrassed if you are feeling these symptoms: Overwhelmed, anxious, angry, sad, guilty, irritable, hopeless, exhausted but can't sleep You are NOT alone. You are NOT to blame. With help, you WILL be well. Where can I find help? Medical professionals such as your OB, midwife, gynecologist, family practitioner, primary care provider, pediatrician, or mental health providers; [facility] support groups: Feelings After Birth, Breastfeeding Support Group, Baby and Me Group, and Fit 4 Two exercise classes. You have permission to ask for help. It will confirm your feelings, validate your experiences,  share/learn coping strategies, and gain support and encouragement as you heal. You are important! BRAINSTORM Make a list of local resources, including resources for mom and for partner. Identify support groups. Identify people to call late at night - include names and contact info. Talk with partner about perinatal mood and anxiety disorders. Talk with your OB, midwife, and doula about baby blues and about perinatal mood and anxiety disorders. Talk with your pediatrician about perinatal mood and anxiety disorders.   Support & Sanity Savers   What do you really need?  Basics In preparing for a new baby, many expectant parents spend hours shopping for baby clothes, decorating the nursery, and deciding which car seat to buy. Yet most don't think much about what the reality of parenting a newborn will be like, and what they need to make it through that. So, here is the advice of experienced parents. We know you'll read this, and think "they're exaggerating, I don't really need that." Just trust [place] on these, OK?  Plan for all of this, and if it turns out you don't need it, come back and teach [place] how you did it!  Must-Haves (Once baby's survival needs are met, make sure you attend to your own survival needs!) Sleep An average newborn sleeps 16-18 hours per day, over 6-7 sleep periods, rarely more than three hours at a time. It is normal and healthy for a newborn to wake throughout the night... but really hard on parents!! Naps. Prioritize sleep above any responsibilities like: cleaning house, visiting friends, running errands, etc.  Sleep whenever baby sleeps. If you can't nap, at least have restful times when baby eats. The more rest you get, the more patient you will be, the more emotionally stable, and better at solving problems.  Food You may not have realized it would be difficult to eat when you have a newborn. Yet, when we talk to countless new parents, they say things like "it may be 2:00 pm  when I realize I haven't had breakfast yet." Or "every time we sit down to dinner, baby needs to eat, and my food gets cold, so I don't bother to eat it." Finger food. Before your baby is born, stock up with one months' worth of food that: 1) you can eat with one hand while holding a baby, 2) doesn't need to be prepped, 3) is good hot or cold, 4) doesn't spoil when left out for a few hours, and 5) you like to eat. Think about: nuts, dried fruit, Clif bars, pretzels, jerky, gogurt, baby carrots, apples, bananas, crackers, cheez-n-crackers, string cheese, hot pockets or frozen burritos to microwave, garden burgers and breakfast pastries to put in the toaster, yogurt drinks, etc. Restaurant Menus. Make lists of your favorite restaurants & menu items. When family/friends want to help, you can give specific information without much thought. They can either bring you the food or send gift cards for just the right meals. Freezer Meals.  Take some time to make a few meals to put in the freezer ahead of time.  Easy to freeze meals can be anything such as soup, lasagna, chicken pie, or spaghetti sauce. Set up a Meal Schedule.  Ask friends and family to sign up to bring you meals during the first few weeks of being home. (It can be passed around at baby showers!) You have no idea how helpful this will be until you are in the throes of parenting.  [employer].it is a great website to check out. Emotional Support Know who to call when you're stressed out. Parenting a newborn is very challenging work. There are times when it totally overwhelms your normal coping abilities. EVERY NEW PARENT NEEDS TO HAVE A PLAN FOR WHO TO CALL WHEN THEY JUST CAN'T COPE ANY MORE. (And it has to be someone other than the baby's other parent!) Before your baby is born, come up with at least one person you can call for support - write their phone number down and post it on the refrigerator. Anxiety & Sadness. Baby blues are normal after  pregnancy; however, there are more severe types of anxiety & sadness which can occur and should not be ignored.  They are always treatable, but you have to take the first step by reaching out for help. [facility] offers a "Mom Talk" group which meets every Tuesday from 10 am - 11 am.  This group is for new moms who need support and connection after their babies are born.  Call [phone].  Really, Really  Helpful (Plan for them! Make sure these happen often!!) Physical Support with Taking Care of Yourselves Asking friends and family. Before your baby is born, set up a schedule of people who can come and visit and help out (or ask a friend to schedule for you). Any time someone says "let me know what I can do to help," sign them up for a day. When they get there, their job is not to take care of the baby (that's your job and your joy). Their job is to take care of you!  Postpartum doulas. If you don't have anyone you can call on for support, look into postpartum doulas:  professionals at helping parents with caring for baby, caring for themselves, getting breastfeeding started, and helping with household tasks. www.padanc.org is a helpful website for learning about doulas in our area. Peer Support / Parent Groups Why: One of the greatest ideas for new parents is to be around other new parents. Parent groups give you a chance to share and listen to others who are going through the same season of life, get a sense of what is normal infant development by watching several babies learn and grow, share your stories of triumph and struggles with empathetic ears, and forgive your own mistakes when you realize all parents are learning by trial and error. Where to find: There are many places you can meet other new parents throughout our community.  [facility] offers the following classes for new moms and their little ones:  Baby and Me (Birth to Crawling) and Breastfeeding Support Group. Go to  [email or web address] or call [phone] for more information. Time for your Relationship It's easy to get so caught up in meeting baby's immediate needs that it's hard to find time to connect with your partner, and meet the needs of your relationship. It's also easy to forget what "quality time with your partner" actually looks like. If you take your baby on a date, you'd be amazed how much of your couple time is spent feeding the baby, diapering the baby, admiring the baby, and talking about the baby. Dating: Try to take time for just the two of you. Babysitter tip: Sometimes when moms are breastfeeding a newborn, they find it hard to figure out how to schedule outings around baby's unpredictable feeding schedules. Have the babysitter come for a three hour period. When she comes over, if baby has just eaten, you can leave right away, and come back in two hours. If baby hasn't fed recently, you start the date at home. Once baby gets hungry and gets a good feeding in, you can head out for the rest of your date time. Date Nights at Home: If you can't get out, at least set aside one evening a week to prioritize your relationship: whenever baby dozes off or doesn't have any immediate needs, spend a little time focusing on each other. Potential conflicts: The main relationship conflicts that come up for new parents are: issues related to sexuality, financial stresses, a feeling of an unfair division of household tasks, and conflicts in parenting styles. The more you can work on these issues before baby arrives, the better!  Fun and Frills (Don't forget these. and don't feel guilty for indulging in them!) Everyone has something in life that is a fun little treat that they do just for themselves. It may be: reading the morning paper, or going for a daily jog, or having coffee with a friend once a week, or going to a  movie on Friday nights, or fine chocolates, or bubble baths, or curling up with a good  book. Unless you do fun things for yourself every now and then, it's hard to have the energy for fun with your baby. Whatever your "special" treats are, make sure you find a way to continue to indulge in them after your baby is born. These special moments can recharge you, and allow you to return to baby with a new joy   PERINATAL MOOD DISORDERS: MATERNAL MENTAL HEALTH FROM CONCEPTION THROUGH THE POSTPARTUM PERIOD   _________________________________________Emergency and Crisis Resources If you are an imminent risk to self or others, are experiencing intense personal distress, and/or have noticed significant changes in activities of daily living, call:  911 [facility]: [phone]  [address] [place], [place], [address] Mobile Crisis: 709-544-7416 National Suicide Hotline: 988 Or visit the following crisis centers: Local Emergency Departments [facility]: [address] [place]  [phone]. Hours: 8:30AM-5PM. Insurance Accepted: Medicaid, Medicare, and Uninsured.  [facility]:  [address] [place]  Mon-Friday 8am-3pm, [phone]                                                                                  ___________ Non-Crisis Resources To identify specific providers that are covered by your insurance, contact your insurance company or local agencies:  [place]: [phone] [facility]: [phone] [place]: 5092205684 Postpartum Support International- Warm-line: 269-661-9593                                                      __Outpatient Therapy and Medication Management   Providers:  [facility]: [phone] Hours: 9AM-5PM  Insurance Accepted: Pilar Jarvis, Chase Caller, [place], Medicare  [facility] Care [facility] Care): [phone] Hours: 8AM-5:30PM  nsurance Accepted: All insurances EXCEPT AARP, [place],  [place], and [employer] of the [place]: [phone] Hours: 8AM-8PM Insurance Accepted: Ulla Gallo, Crista Luria, [place], Medicare, [phone] [facility]: [phone] Hours: 8:30AM-7PM Insurance Accepted: Ulla Gallo, Medicaid, Medicare, Tricare, [employer] [facility]:  [phone]   Hours:9AM-5PM Insurance Accepted:  Monia Pouch, Ezequiel Essex, [employer], [place], [employer] [facility]  [facility]: [phone] Hours: 9AM-5:30PM Insurance Accepted: Pilar Jarvis, Teodoro Spray, and Medicaid, Medicare, [facility]:  [phone] Hours: 9am-5pm Insurance Accepted: BCBS; they do not accept Medicaid/Medicare [facility]: [phone] Hours: 9am-9pm Insurance Accepted: All major insurance including Medicaid and Medicare [facility]: [phone] Hours: 9AM- 5PM Insurance Accepted: All insurances EXCEPT Medicaid and Medicare. [facility]: [phone]   ____________  Parenting Support Groups [facility]: [phone] [facility]:  [phone] Family Support Network: (support for children in the NICU and/or with special needs), [phone]   ___________                                                                 Mental Health Support Groups Mental Health Association: [phone]    _____________                                                                                  Online Resources Postpartum Support International: SeekAlumni.co.za  800-944-4PPD Supporting Moms:  www.momssupportingmoms.net

## 2022-10-10 ENCOUNTER — Other Ambulatory Visit: Payer: Self-pay | Admitting: *Deleted

## 2022-10-10 ENCOUNTER — Encounter: Payer: Self-pay | Admitting: *Deleted

## 2022-10-10 ENCOUNTER — Ambulatory Visit: Payer: Medicaid Other | Admitting: *Deleted

## 2022-10-10 ENCOUNTER — Ambulatory Visit: Payer: Medicaid Other

## 2022-10-10 ENCOUNTER — Ambulatory Visit: Payer: Self-pay

## 2022-10-10 ENCOUNTER — Ambulatory Visit: Payer: Medicaid Other | Attending: Family Medicine

## 2022-10-10 ENCOUNTER — Ambulatory Visit (HOSPITAL_BASED_OUTPATIENT_CLINIC_OR_DEPARTMENT_OTHER): Payer: Medicaid Other | Admitting: Obstetrics

## 2022-10-10 VITALS — BP 118/63 | HR 91

## 2022-10-10 DIAGNOSIS — G40909 Epilepsy, unspecified, not intractable, without status epilepticus: Secondary | ICD-10-CM | POA: Diagnosis not present

## 2022-10-10 DIAGNOSIS — Z3A24 24 weeks gestation of pregnancy: Secondary | ICD-10-CM | POA: Insufficient documentation

## 2022-10-10 DIAGNOSIS — O99352 Diseases of the nervous system complicating pregnancy, second trimester: Secondary | ICD-10-CM

## 2022-10-10 DIAGNOSIS — O0990 Supervision of high risk pregnancy, unspecified, unspecified trimester: Secondary | ICD-10-CM | POA: Insufficient documentation

## 2022-10-10 DIAGNOSIS — O283 Abnormal ultrasonic finding on antenatal screening of mother: Secondary | ICD-10-CM | POA: Diagnosis present

## 2022-10-10 DIAGNOSIS — O99212 Obesity complicating pregnancy, second trimester: Secondary | ICD-10-CM | POA: Insufficient documentation

## 2022-10-10 DIAGNOSIS — O24419 Gestational diabetes mellitus in pregnancy, unspecified control: Secondary | ICD-10-CM | POA: Insufficient documentation

## 2022-10-10 DIAGNOSIS — O34219 Maternal care for unspecified type scar from previous cesarean delivery: Secondary | ICD-10-CM

## 2022-10-10 DIAGNOSIS — O285 Abnormal chromosomal and genetic finding on antenatal screening of mother: Secondary | ICD-10-CM | POA: Insufficient documentation

## 2022-10-10 DIAGNOSIS — E669 Obesity, unspecified: Secondary | ICD-10-CM

## 2022-10-10 DIAGNOSIS — Z148 Genetic carrier of other disease: Secondary | ICD-10-CM

## 2022-10-10 NOTE — Progress Notes (Signed)
MFM Note  Marissa Cross was seen for a detailed fetal anatomy scan due to maternal obesity.  Her Horizon test indicated an increased carrier risk for spinal muscular atrophy.  She denies any problems in her current pregnancy.    She had a cell free DNA test earlier in her pregnancy which indicated a low risk for trisomy 21, 32, and 13. A female fetus is predicted.   Based on the fetal biometry measurements obtained today, her EDC was changed to January 28, 2023, making her 24 weeks and 2 days pregnant today.    The [facility] of January 28, 2023 is also consistent with the [facility] based on an early ultrasound performed in your office.  The patient reports that her prior [facility] was based on an irregular and uncertain LMP.  On today's exam, echogenic bowel was noted. The causes of echogenic bowel including a normal variant, fetal aneuploidy, swallowed  blood, cystic fibrosis, and viral infections were discussed.  The association of bowel malrotation/atresia associated with echogenic bowel was also discussed. She denies any recent vaginal bleeding.  Her Horizon test was negative for cystic fibrosis.    Due to the echogenic bowel noted today, the patient was offered and declined an amniocentesis for definitive diagnosis of fetal aneuploidy.  She is comfortable with her negative cell free DNA test.   Due to the echogenic bowel noted today, she was sent to the lab to be screened for CMV and toxoplasmosis.  Due to the potential association of fetal growth restriction with echogenic bowel, we will continue to follow her with growth ultrasounds throughout her pregnancy.  We will also assess for signs of bowel dilatation which may be a sign of gut malrotation or atresia later in her pregnancy.  The patient met with our genetic counselor following today's ultrasound exam to discuss the significance of being an increased carrier risk for spinal muscular atrophy (SMA).  Her partner will consider getting screened to determine  if he is also a carrier for SMA.  A follow up exam was scheduled in 4 weeks.   The patient stated that all of her questions had been answered.  A total of 45 minutes was spent counseling and coordinating the care for this patient.  Greater than 50% of the time was spent in direct face-to-face contact.

## 2022-10-11 LAB — CMV ANTIBODY, IGG (EIA): CMV Ab - IgG: 0.96 U/mL — ABNORMAL HIGH (ref 0.00–0.59)

## 2022-10-11 LAB — CMV IGM: CMV IgM Ser EIA-aCnc: <30 [AU]/mL (ref 0.0–29.9)

## 2022-10-11 LAB — TOXOPLASMA GONDII ANTIBODY, IGM: Toxoplasma Antibody- IgM: <3 [AU]/mL (ref 0.0–7.9)

## 2022-10-11 LAB — INFECT DISEASE AB IGM REFLEX 1

## 2022-10-11 LAB — TOXOPLASMA GONDII ANTIBODY, IGG: Toxoplasma IgG Ratio: <3 [IU]/mL (ref 0.0–7.1)

## 2022-10-14 ENCOUNTER — Encounter: Payer: Self-pay | Admitting: Neurology

## 2022-10-14 ENCOUNTER — Ambulatory Visit: Payer: Medicaid Other | Admitting: Neurology

## 2022-10-14 VITALS — BP 115/74 | HR 106 | Ht 63.0 in | Wt 200.8 lb

## 2022-10-14 DIAGNOSIS — R404 Transient alteration of awareness: Secondary | ICD-10-CM

## 2022-10-14 DIAGNOSIS — G43009 Migraine without aura, not intractable, without status migrainosus: Secondary | ICD-10-CM | POA: Diagnosis not present

## 2022-10-14 DIAGNOSIS — R519 Headache, unspecified: Secondary | ICD-10-CM

## 2022-10-14 MED ORDER — RIBOFLAVIN 400 MG PO CAPS: ORAL_CAPSULE | ORAL | 3 refills | Status: DC

## 2022-10-14 MED ORDER — MAGNESIUM OXIDE 400 MG PO CAPS: ORAL_CAPSULE | ORAL | 3 refills | Status: DC

## 2022-10-14 MED ORDER — ZONISAMIDE 100 MG PO CAPS: ORAL_CAPSULE | ORAL | 3 refills | Status: DC
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
## 2022-10-16 NOTE — BH Specialist Note (Signed)
Integrated Behavioral Health via Telemedicine Visit  10/30/2022 RASHAWNDA GABA [id number]  Number of Integrated Behavioral Health Clinician visits: 2- Second Visit  Session Start time: 0950   Session End time: 1011  Total time in minutes: 21   Referring Provider: Tinnie Gens, MD Patient/Family location: Home [facility] Provider location: [facility]  All persons participating in visit: Patient Tory Mckissack and [facility] Siniyah Evangelist   Types of Service: Individual psychotherapy and Video visit  I connected with Katelynd Shively and/or Gizzelle Cogan's  n/a  via  Telephone or Video Enabled Telemedicine Application  (Video is Caregility application) and verified that I am speaking with the correct person using two identifiers. Discussed confidentiality: Yes   I discussed the limitations of telemedicine and the availability of in person appointments.  Discussed there is a possibility of technology failure and discussed alternative modes of communication if that failure occurs.  I discussed that engaging in this telemedicine visit, they consent to the provision of behavioral healthcare and the services will be billed under their insurance.  Patient and/or legal guardian expressed understanding and consented to Telemedicine visit: Yes   Presenting Concerns: Patient and/or family reports the following symptoms/concerns: Loss of aunt last week, followed by mom hospitalized; Unable to sleep at night due to discomfort in pregnancy; taking naps in daytime and using self-coping strategies to cope.  Duration of problem: One week since loss;ongoing anxiety ; Severity of problem: moderate  Patient and/or Family's Strengths/Protective Factors: Social connections, Concrete supports in place (healthy food, safe environments, etc.), and Sense of purpose  Goals Addressed: Patient will:  Reduce symptoms of: anxiety, insomnia, and stress    Demonstrate ability to:  Increase healthy adjustment to current life circumstances and Increase motivation to adhere to plan of care  Progress towards Goals: Ongoing  Interventions: Interventions utilized:  Motivational Interviewing and Supportive Reflection Standardized Assessments completed: Not Needed  Patient and/or Family Response: Patient agrees with treatment plan.   Assessment: Patient currently experiencing Grief; Generalized anxiety disorder.   Patient may benefit from psychoeducation and brief therapeutic interventions regarding coping with symptoms of anxiety, life stress and grief .  Plan: Follow up with behavioral health clinician on : Three weeks Behavioral recommendations:  -Continue using self-coping strategies daily as needed (relaxation breathing, modified worry time to manage life stress, etc) -Continue using maternity belt for daytime comfort; consider options for maternity or other pillows for nighttime (or daytime) comfort with sleep -Continue to allow time and space for grieving loss  I discussed the assessment and treatment plan with the patient and/or parent/guardian. They were provided an opportunity to ask questions and all were answered. They agreed with the plan and demonstrated an understanding of the instructions.   They were advised to call back or seek an in-person evaluation if the symptoms worsen or if the condition fails to improve as anticipated.  Rae Lips, LCSW     08/20/2022    3:48 PM  Depression screen PHQ 2/9  Decreased Interest 1  Down, Depressed, Hopeless 1  PHQ - 2 Score 2  Altered sleeping 0  Tired, decreased energy 1  Change in appetite 0  Feeling bad or failure about yourself  0  Trouble concentrating 1  Moving slowly or fidgety/restless 1  PHQ-9 Score 5      08/20/2022    3:49 PM  GAD 7 : Generalized Anxiety Score  Nervous, Anxious, on Edge 3  Control/stop worrying 2  Worry too  much - different things 2  Trouble relaxing 1  Restless 1   Easily annoyed or irritable 1  Afraid - awful might happen 1  Total GAD 7 Score 11

## 2022-10-22 ENCOUNTER — Ambulatory Visit: Payer: Medicaid Other | Admitting: Neurology

## 2022-10-23 ENCOUNTER — Other Ambulatory Visit: Payer: Self-pay

## 2022-10-23 ENCOUNTER — Ambulatory Visit (INDEPENDENT_AMBULATORY_CARE_PROVIDER_SITE_OTHER): Payer: Medicaid Other | Admitting: Family Medicine

## 2022-10-23 VITALS — BP 101/67 | HR 105 | Wt 199.8 lb

## 2022-10-23 DIAGNOSIS — O34219 Maternal care for unspecified type scar from previous cesarean delivery: Secondary | ICD-10-CM

## 2022-10-23 DIAGNOSIS — G40909 Epilepsy, unspecified, not intractable, without status epilepticus: Secondary | ICD-10-CM

## 2022-10-23 DIAGNOSIS — R519 Headache, unspecified: Secondary | ICD-10-CM | POA: Insufficient documentation

## 2022-10-23 DIAGNOSIS — O26893 Other specified pregnancy related conditions, third trimester: Secondary | ICD-10-CM | POA: Insufficient documentation

## 2022-10-23 DIAGNOSIS — O0990 Supervision of high risk pregnancy, unspecified, unspecified trimester: Secondary | ICD-10-CM

## 2022-10-23 DIAGNOSIS — O99352 Diseases of the nervous system complicating pregnancy, second trimester: Secondary | ICD-10-CM

## 2022-10-23 DIAGNOSIS — O0992 Supervision of high risk pregnancy, unspecified, second trimester: Secondary | ICD-10-CM

## 2022-10-23 DIAGNOSIS — Z3A26 26 weeks gestation of pregnancy: Secondary | ICD-10-CM

## 2022-10-23 DIAGNOSIS — O26892 Other specified pregnancy related conditions, second trimester: Secondary | ICD-10-CM

## 2022-10-23 NOTE — Progress Notes (Signed)
   PRENATAL VISIT NOTE  Subjective:  Marissa Cross is a 32 y.o. G3P2002 at [redacted]w[redacted]d being seen today for ongoing prenatal care.  She is currently monitored for the following issues for this high-risk pregnancy and has Mild dysplasia of cervix (CIN I); Supervision of high risk pregnancy, antepartum; Seizure disorder during pregnancy (HCC); Previous cesarean delivery, antepartum; and Headache in pregnancy, antepartum, third trimester on their problem list.  Patient reports no complaints.  Contractions: Not present. Vag. Bleeding: None.  Movement: Present. Denies leaking of fluid.   The following portions of the patient's history were reviewed and updated as appropriate: allergies, current medications, past family history, past medical history, past social history, past surgical history and problem list.   Objective:   Vitals:   10/23/22 1600  BP: 101/67  Pulse: (!) 105  Weight: 199 lb 12.8 oz (90.6 kg)    Fetal Status: Fetal Heart Rate (bpm): 147   Movement: Present     General:  Alert, oriented and cooperative. Patient is in no acute distress.  Skin: Skin is warm and dry. No rash noted.   Cardiovascular: Normal heart rate noted  Respiratory: Normal respiratory effort, no problems with respiration noted  Abdomen: Soft, gravid, appropriate for gestational age.  Pain/Pressure: Absent     Pelvic: Cervical exam deferred        Extremities: Normal range of motion.  Edema: None  Mental Status: Normal mood and affect. Normal behavior. Normal judgment and thought content.   Assessment and Plan:  Pregnancy: G3P2002 at [redacted]w[redacted]d 1. Supervision of high risk pregnancy, antepartum Up to date  FH appropriate Vigorous movement Patient asked about WB and is not a candidate due to TOLAC - CBC - RPR - HIV Antibody (routine testing w rflx)  2. Previous cesarean delivery, antepartum Desires TOLAC Given consent form to read  Plan to sign next visit  3. Seizure disorder during pregnancy in third  trimester [facility]) Sees neurology  4. Headache in pregnancy, antepartum, third trimester Encouraged use of magnesium Also OK to try benadryl Has flexeril at home No focal findings Had HAs prior to pregnancy, no concerns about PEC with very normal BP   Preterm labor symptoms and general obstetric precautions including but not limited to vaginal bleeding, contractions, leaking of fluid and fetal movement were reviewed in detail with the patient. Please refer to After Visit Summary for other counseling recommendations.   Return in about 2 weeks (around 11/06/2022) for Routine prenatal care, MD or APP.  Future Appointments  Date Time Provider Department Center  10/30/2022  9:45 AM [facility] HEALTH CLINICIAN [facility]  11/08/2022  8:15 AM Edgar Bing, MD [facility]  11/08/2022  8:50 AM [facility]  11/11/2022  9:30 AM [facility] US5 [facility]  02/14/2023 11:30 AM Van Clines, MD [facility] None    Federico Flake, MD

## 2022-10-24 LAB — CBC
Hematocrit: 34.9 % (ref 34.0–46.6)
Hemoglobin: 11.8 g/dL (ref 11.1–15.9)
MCH: 30.5 pg (ref 26.6–33.0)
MCHC: 33.8 g/dL (ref 31.5–35.7)
MCV: 90 fL (ref 79–97)
Platelets: 179 10*3/uL (ref 150–450)
RBC: 3.87 x10E6/uL (ref 3.77–5.28)
RDW: 13.3 % (ref 11.7–15.4)
WBC: 6 10*3/uL (ref 3.4–10.8)

## 2022-10-24 LAB — HIV ANTIBODY (ROUTINE TESTING W REFLEX): HIV Screen 4th Generation wRfx: NONREACTIVE

## 2022-10-24 LAB — RPR: RPR Ser Ql: NONREACTIVE

## 2022-10-30 ENCOUNTER — Ambulatory Visit (INDEPENDENT_AMBULATORY_CARE_PROVIDER_SITE_OTHER): Payer: Medicaid Other | Admitting: Clinical

## 2022-10-30 DIAGNOSIS — F411 Generalized anxiety disorder: Secondary | ICD-10-CM

## 2022-10-30 DIAGNOSIS — F4321 Adjustment disorder with depressed mood: Secondary | ICD-10-CM

## 2022-10-30 DIAGNOSIS — O99210 Obesity complicating pregnancy, unspecified trimester: Secondary | ICD-10-CM | POA: Insufficient documentation

## 2022-10-30 NOTE — Patient Instructions (Signed)
[facility] [address] [place], [place] [address] [phone] (main office) [phone] (Aquan Kope's office)   

## 2022-11-05 NOTE — BH Specialist Note (Deleted)
Integrated Behavioral Health via Telemedicine Visit  11/05/2022 ENEYDA BOLINSKI [id number]  Number of Integrated Behavioral Health Clinician visits: 2- Second Visit  Session Start time: 0950   Session End time: 1011  Total time in minutes: 21   Referring Provider: *** Patient/Family location: [facility] Provider location: *** All persons participating in visit: *** Types of Service: {CHL AMB TYPE OF [phone]}  I connected with Noura Hinners and/or Luvern Heavrin's {family members:20773} via  Telephone or Video Enabled Telemedicine Application  (Video is Caregility application) and verified that I am speaking with the correct person using two identifiers. Discussed confidentiality: {YES/NO:21197}  I discussed the limitations of telemedicine and the availability of in person appointments.  Discussed there is a possibility of technology failure and discussed alternative modes of communication if that failure occurs.  I discussed that engaging in this telemedicine visit, they consent to the provision of behavioral healthcare and the services will be billed under their insurance.  Patient and/or legal guardian expressed understanding and consented to Telemedicine visit: {YES/NO:21197}  Presenting Concerns: Patient and/or family reports the following symptoms/concerns: *** Duration of problem: ***; Severity of problem: {Mild/Moderate/Severe:20260}  Patient and/or Family's Strengths/Protective Factors: {CHL AMB BH PROTECTIVE FACTORS:[phone]}  Goals Addressed: Patient will:  Reduce symptoms of: {IBH Symptoms:21014056}   Increase knowledge and/or ability of: {IBH Patient Tools:21014057}   Demonstrate ability to: {IBH Goals:21014053}  Progress towards Goals: {CHL AMB BH PROGRESS TOWARDS [phone]}  Interventions: Interventions utilized:  {IBH Interventions:21014054} Standardized Assessments completed: {IBH Screening Tools:21014051}  Patient and/or Family Response:  ***  Assessment: Patient currently experiencing ***.   Patient may benefit from ***.  Plan: Follow up with behavioral health clinician on : *** Behavioral recommendations: *** Referral(s): {IBH Referrals:21014055}  I discussed the assessment and treatment plan with the patient and/or parent/guardian. They were provided an opportunity to ask questions and all were answered. They agreed with the plan and demonstrated an understanding of the instructions.   They were advised to call back or seek an in-person evaluation if the symptoms worsen or if the condition fails to improve as anticipated.  Valetta Close Minnetta Sandora, LCSW

## 2022-11-06 ENCOUNTER — Other Ambulatory Visit: Payer: Self-pay

## 2022-11-06 DIAGNOSIS — O0990 Supervision of high risk pregnancy, unspecified, unspecified trimester: Secondary | ICD-10-CM

## 2022-11-08 ENCOUNTER — Ambulatory Visit (INDEPENDENT_AMBULATORY_CARE_PROVIDER_SITE_OTHER): Payer: Medicaid Other | Admitting: Obstetrics and Gynecology

## 2022-11-08 ENCOUNTER — Other Ambulatory Visit: Payer: Medicaid Other

## 2022-11-08 ENCOUNTER — Encounter: Payer: Self-pay | Admitting: Obstetrics and Gynecology

## 2022-11-08 ENCOUNTER — Other Ambulatory Visit: Payer: Self-pay

## 2022-11-08 VITALS — BP 110/72 | HR 102 | Wt 201.0 lb

## 2022-11-08 DIAGNOSIS — O0990 Supervision of high risk pregnancy, unspecified, unspecified trimester: Secondary | ICD-10-CM

## 2022-11-08 DIAGNOSIS — O0993 Supervision of high risk pregnancy, unspecified, third trimester: Secondary | ICD-10-CM

## 2022-11-08 DIAGNOSIS — O26893 Other specified pregnancy related conditions, third trimester: Secondary | ICD-10-CM

## 2022-11-08 DIAGNOSIS — O99353 Diseases of the nervous system complicating pregnancy, third trimester: Secondary | ICD-10-CM

## 2022-11-08 DIAGNOSIS — F411 Generalized anxiety disorder: Secondary | ICD-10-CM

## 2022-11-08 DIAGNOSIS — O99210 Obesity complicating pregnancy, unspecified trimester: Secondary | ICD-10-CM

## 2022-11-08 DIAGNOSIS — G40909 Epilepsy, unspecified, not intractable, without status epilepticus: Secondary | ICD-10-CM

## 2022-11-08 DIAGNOSIS — O283 Abnormal ultrasonic finding on antenatal screening of mother: Secondary | ICD-10-CM | POA: Insufficient documentation

## 2022-11-08 DIAGNOSIS — R519 Headache, unspecified: Secondary | ICD-10-CM

## 2022-11-08 DIAGNOSIS — O34219 Maternal care for unspecified type scar from previous cesarean delivery: Secondary | ICD-10-CM

## 2022-11-08 DIAGNOSIS — O285 Abnormal chromosomal and genetic finding on antenatal screening of mother: Secondary | ICD-10-CM | POA: Insufficient documentation

## 2022-11-08 DIAGNOSIS — O99213 Obesity complicating pregnancy, third trimester: Secondary | ICD-10-CM

## 2022-11-08 DIAGNOSIS — Z3A28 28 weeks gestation of pregnancy: Secondary | ICD-10-CM

## 2022-11-08 DIAGNOSIS — Z6835 Body mass index (BMI) 35.0-35.9, adult: Secondary | ICD-10-CM

## 2022-11-08 HISTORY — DX: Abnormal chromosomal and genetic finding on antenatal screening of mother: O28.5

## 2022-11-08 HISTORY — DX: Generalized anxiety disorder: F41.1

## 2022-11-08 NOTE — Progress Notes (Signed)
   PRENATAL VISIT NOTE  Subjective:  Marissa Cross is a 32 y.o. G3P2002 at [redacted]w[redacted]d being seen today for ongoing prenatal care.  She is currently monitored for the following issues for this high-risk pregnancy and has Mild dysplasia of cervix (CIN I); Supervision of high risk pregnancy, antepartum; Seizure disorder during pregnancy in third trimester (HCC); Previous cesarean delivery, antepartum; Headache in pregnancy, antepartum, third trimester; Obesity affecting pregnancy, antepartum; Echogenic bowel of fetus on prenatal ultrasound; Elevated SMA risk; and Generalized anxiety disorder on their problem list.  Patient reports no complaints.  Contractions: Not present. Vag. Bleeding: None.  Movement: Present. Denies leaking of fluid.   The following portions of the patient's history were reviewed and updated as appropriate: allergies, current medications, past family history, past medical history, past social history, past surgical history and problem list.   Objective:   Vitals:   11/08/22 0844  BP: 110/72  Pulse: (!) 102  Weight: 201 lb (91.2 kg)    Fetal Status: Fetal Heart Rate (bpm): 145   Movement: Present     General:  Alert, oriented and cooperative. Patient is in no acute distress.  Skin: Skin is warm and dry. No rash noted.   Cardiovascular: Normal heart rate noted  Respiratory: Normal respiratory effort, no problems with respiration noted  Abdomen: Soft, gravid, appropriate for gestational age.  Pain/Pressure: Present     Pelvic: Cervical exam deferred        Extremities: Normal range of motion.  Edema: None  Mental Status: Normal mood and affect. Normal behavior. Normal judgment and thought content.   Assessment and Plan:  Pregnancy: G3P2002 at [redacted]w[redacted]d 1. [redacted] weeks gestation of pregnancy Thought she was having a 1h GTT so wasn't fasting; pt come back on Monday for 2h GTT/28wk labs  2. Previous cesarean delivery, antepartum 2018 PLTCS for abruption @ 40w D/w her re: tolac vs  rpt c/s and pt unsure. Form given to her and will d/w her more at next visit  3. Headache in pregnancy, antepartum, third trimester Followed by neuro, currently on zonegran  4. Seizure disorder during pregnancy in third trimester (HCC) See above  5. Obesity affecting pregnancy, antepartum, unspecified obesity type Weight stable  6. BMI 35.0-35.9,adult  7. Echogenic bowel of fetus on prenatal ultrasound Neg CF and TORCH titers  8. Supervision of high risk pregnancy, antepartum Has rpt growth next week 9/26: 62%, 729gm, ac 77%, afi wnl  9. Generalized anxiety disorder Seeing Asher Muir on 11/5  Preterm labor symptoms and general obstetric precautions including but not limited to vaginal bleeding, contractions, leaking of fluid and fetal movement were reviewed in detail with the patient. Please refer to After Visit Summary for other counseling recommendations.   Return in about 11 days (around 11/19/2022) for in person, md visit, high risk ob.  Future Appointments  Date Time Provider Department Center  11/11/2022  8:50 AM [facility] LAB [facility]  11/11/2022  9:30 AM [facility] US5 [facility]  11/19/2022 10:15 AM [facility] CL[facility]  02/14/2023 11:30 AM Van Clines, MD LBN-[facility] None    [place] Bing, MD

## 2022-11-11 ENCOUNTER — Other Ambulatory Visit: Payer: Medicaid Other

## 2022-11-11 ENCOUNTER — Ambulatory Visit: Payer: Medicaid Other | Attending: Obstetrics

## 2022-11-11 DIAGNOSIS — O99210 Obesity complicating pregnancy, unspecified trimester: Secondary | ICD-10-CM

## 2022-11-15 DIAGNOSIS — O24419 Gestational diabetes mellitus in pregnancy, unspecified control: Secondary | ICD-10-CM

## 2022-11-15 HISTORY — DX: Gestational diabetes mellitus in pregnancy, unspecified control: O24.419

## 2022-11-19 ENCOUNTER — Ambulatory Visit (INDEPENDENT_AMBULATORY_CARE_PROVIDER_SITE_OTHER): Payer: Medicaid Other | Admitting: Family Medicine

## 2022-11-19 ENCOUNTER — Other Ambulatory Visit: Payer: Self-pay

## 2022-11-19 ENCOUNTER — Ambulatory Visit: Payer: Medicaid Other | Attending: Obstetrics

## 2022-11-19 VITALS — BP 120/67 | HR 109 | Wt 203.3 lb

## 2022-11-19 DIAGNOSIS — O99353 Diseases of the nervous system complicating pregnancy, third trimester: Secondary | ICD-10-CM

## 2022-11-19 DIAGNOSIS — Z3A30 30 weeks gestation of pregnancy: Secondary | ICD-10-CM

## 2022-11-19 DIAGNOSIS — O99212 Obesity complicating pregnancy, second trimester: Secondary | ICD-10-CM | POA: Insufficient documentation

## 2022-11-19 DIAGNOSIS — O34219 Maternal care for unspecified type scar from previous cesarean delivery: Secondary | ICD-10-CM | POA: Insufficient documentation

## 2022-11-19 DIAGNOSIS — O99213 Obesity complicating pregnancy, third trimester: Secondary | ICD-10-CM

## 2022-11-19 DIAGNOSIS — O26893 Other specified pregnancy related conditions, third trimester: Secondary | ICD-10-CM

## 2022-11-19 DIAGNOSIS — Z6835 Body mass index (BMI) 35.0-35.9, adult: Secondary | ICD-10-CM

## 2022-11-19 DIAGNOSIS — E669 Obesity, unspecified: Secondary | ICD-10-CM | POA: Diagnosis not present

## 2022-11-19 DIAGNOSIS — O0990 Supervision of high risk pregnancy, unspecified, unspecified trimester: Secondary | ICD-10-CM

## 2022-11-19 DIAGNOSIS — O0993 Supervision of high risk pregnancy, unspecified, third trimester: Secondary | ICD-10-CM

## 2022-11-19 DIAGNOSIS — O99352 Diseases of the nervous system complicating pregnancy, second trimester: Secondary | ICD-10-CM | POA: Insufficient documentation

## 2022-11-19 DIAGNOSIS — G40909 Epilepsy, unspecified, not intractable, without status epilepticus: Secondary | ICD-10-CM | POA: Insufficient documentation

## 2022-11-19 DIAGNOSIS — O99210 Obesity complicating pregnancy, unspecified trimester: Secondary | ICD-10-CM

## 2022-11-19 DIAGNOSIS — O283 Abnormal ultrasonic finding on antenatal screening of mother: Secondary | ICD-10-CM

## 2022-11-19 DIAGNOSIS — F411 Generalized anxiety disorder: Secondary | ICD-10-CM

## 2022-11-19 DIAGNOSIS — Z148 Genetic carrier of other disease: Secondary | ICD-10-CM

## 2022-11-19 DIAGNOSIS — R519 Headache, unspecified: Secondary | ICD-10-CM

## 2022-11-19 NOTE — Progress Notes (Signed)
   PRENATAL VISIT NOTE  Subjective:  Marissa Cross is a 32 y.o. G3P2002 at [redacted]w[redacted]d being seen today for ongoing prenatal care.  She is currently monitored for the following issues for this high-risk pregnancy and has Mild dysplasia of cervix (CIN I); Supervision of high risk pregnancy, antepartum; Seizure disorder during pregnancy in third trimester (HCC); Previous cesarean delivery, antepartum; Headache in pregnancy, antepartum, third trimester; Obesity affecting pregnancy, antepartum; Echogenic bowel of fetus on prenatal ultrasound; Elevated SMA risk; and Generalized anxiety disorder on their problem list.  Patient reports no bleeding, no contractions, no cramping, and no leaking.   .  .   . Denies leaking of fluid.   The following portions of the patient's history were reviewed and updated as appropriate: allergies, current medications, past family history, past medical history, past social history, past surgical history and problem list.   Objective:  There were no vitals filed for this visit.  Fetal Status:           General:  Alert, oriented and cooperative. Patient is in no acute distress.  Skin: Skin is warm and dry. No rash noted.   Cardiovascular: Normal heart rate noted  Respiratory: Normal respiratory effort, no problems with respiration noted  Abdomen: Soft, gravid, appropriate for gestational age.        Pelvic: Cervical exam deferred        Extremities: Normal range of motion.     Mental Status: Normal mood and affect. Normal behavior. Normal judgment and thought content.   Assessment and Plan:  Pregnancy: G3P2002 at [redacted]w[redacted]d 1. Supervision of high risk pregnancy, antepartum FHR and BP within normal limits today Patient has OB follow-up scan scheduled today at 330  2. Previous cesarean delivery, antepartum TOLAC consent signed today  3. Headache in pregnancy, antepartum, third trimester Improving on medications prescribed by neurology  4. Seizure disorder during pregnancy  in third trimester [facility]) Follows with neurology, no recent seizures  5. Obesity affecting pregnancy, antepartum, unspecified obesity type   6. Echogenic bowel of fetus on prenatal ultrasound Patient with negative CF and TORCH titers  7. BMI 35.0-35.9,adult  8. [redacted] weeks gestation of pregnancy Patient is yet to have 28-week labs  9. Generalized anxiety disorder Currently seeing [name]  Preterm labor symptoms and general obstetric precautions including but not limited to vaginal bleeding, contractions, leaking of fluid and fetal movement were reviewed in detail with the patient. Please refer to After Visit Summary for other counseling recommendations.   No follow-ups on file.  Future Appointments  Date Time Provider Department Center  11/19/2022  3:30 PM [facility] US6 [facility]  11/22/2022  8:50 AM [facility] LAB [facility]  12/11/2022 11:15 AM Federico Flake, MD [facility]  12/20/2022  8:45 AM [facility]IOR[facility] HE[facility] CLINICIAN [facility]  02/14/2023 11:30 AM Van Clines, MD [facility] None    Celedonio Savage, MD

## 2022-11-20 ENCOUNTER — Other Ambulatory Visit: Payer: Self-pay | Admitting: *Deleted

## 2022-11-20 DIAGNOSIS — O35DXX0 Maternal care for other (suspected) fetal abnormality and damage, fetal gastrointestinal anomalies, not applicable or unspecified: Secondary | ICD-10-CM

## 2022-11-22 ENCOUNTER — Other Ambulatory Visit: Payer: Self-pay

## 2022-11-22 ENCOUNTER — Other Ambulatory Visit: Payer: Medicaid Other

## 2022-11-22 DIAGNOSIS — O0990 Supervision of high risk pregnancy, unspecified, unspecified trimester: Secondary | ICD-10-CM

## 2022-11-23 LAB — CBC
Hematocrit: 35.4 % (ref 34.0–46.6)
Hemoglobin: 11.9 g/dL (ref 11.1–15.9)
MCH: 30.4 pg (ref 26.6–33.0)
MCHC: 33.6 g/dL (ref 31.5–35.7)
MCV: 91 fL (ref 79–97)
Platelets: 173 10*3/uL (ref 150–450)
RBC: 3.91 x10E6/uL (ref 3.77–5.28)
RDW: 14.1 % (ref 11.7–15.4)
WBC: 5.5 10*3/uL (ref 3.4–10.8)

## 2022-11-23 LAB — RPR: RPR Ser Ql: NONREACTIVE

## 2022-11-23 LAB — GLUCOSE TOLERANCE, 2 HOURS W/ 1HR
Glucose, 1 hour: 192 mg/dL — ABNORMAL HIGH (ref 70–179)
Glucose, 2 hour: 159 mg/dL — ABNORMAL HIGH (ref 70–152)
Glucose, Fasting: 80 mg/dL (ref 70–91)

## 2022-11-23 LAB — HIV ANTIBODY (ROUTINE TESTING W REFLEX): HIV Screen 4th Generation wRfx: NONREACTIVE

## 2022-11-26 ENCOUNTER — Telehealth: Payer: Self-pay | Admitting: *Deleted

## 2022-11-26 ENCOUNTER — Encounter: Payer: Self-pay | Admitting: Obstetrics and Gynecology

## 2022-11-26 DIAGNOSIS — O24419 Gestational diabetes mellitus in pregnancy, unspecified control: Secondary | ICD-10-CM | POA: Insufficient documentation

## 2022-11-26 DIAGNOSIS — O0990 Supervision of high risk pregnancy, unspecified, unspecified trimester: Secondary | ICD-10-CM

## 2022-11-26 MED ORDER — ACCU-CHEK SOFTCLIX LANCETS MISC: 12 refills | Status: DC

## 2022-11-26 MED ORDER — ACCU-CHEK GUIDE W/DEVICE KIT: 1.0000 | PACK | 0 refills | Status: DC | PRN

## 2022-11-26 MED ORDER — ACCU-CHEK GUIDE VI STRP: ORAL_STRIP | 12 refills | Status: DC

## 2022-11-26 NOTE — Telephone Encounter (Signed)
I called Marissa Cross and informed her of results and recommendations.  She agreed to appointment on 12/03/22 for education. I informed her I will send in prescriptions for her diabetes supplies for her to bring to her education appointment. She voices understanding. [name]

## 2022-11-26 NOTE — Telephone Encounter (Signed)
-----   Message from [facility] sent at 11/26/2022  7:54 AM EST ----- Has new GDM. Please set her up with supplies, classes, teaching, etc. thanks

## 2022-12-03 ENCOUNTER — Ambulatory Visit (INDEPENDENT_AMBULATORY_CARE_PROVIDER_SITE_OTHER): Payer: Medicaid Other | Admitting: Dietician

## 2022-12-03 ENCOUNTER — Other Ambulatory Visit: Payer: Self-pay

## 2022-12-03 DIAGNOSIS — Z3A32 32 weeks gestation of pregnancy: Secondary | ICD-10-CM

## 2022-12-03 DIAGNOSIS — O24419 Gestational diabetes mellitus in pregnancy, unspecified control: Secondary | ICD-10-CM

## 2022-12-03 NOTE — Progress Notes (Signed)
Patient was seen for Gestational Diabetes self-management on 12/03/2022  Start time 1120 and End time 1210   Estimated due date: 01/26/2022; [redacted]w[redacted]d  Clinical: Medications: Prenatal vitamin Medical History: GDM Labs: OGTT fasting 80, 1 hour 146, 157, 2 hours 159 11/22/2022  Dietary and Lifestyle History: Patient lives with her mother and children some and her fiance at times.  Her fiance is a long distance truck driver and will be transitioning to a local job at the beginning of the year.   She is not working currently.  Her mother is sick and she cares for her.  She drove truck in the past as well.  Physical Activity: walks the dog 30-40 minutes per day Stress: 6/10 stress and anxiety Sleep: poor as she cannot get comfortable  24 hr Recall:  First Meal: sausage biscuit (1/2), grape jelly, hashbrown (1/2) Snack: bite of pecan pie Second meal: leftover hamburger steak and gravy, mashed potatoes Snack:  apple Third meal:  cook out - cheeseburger quesadilla, hush puppies and honey mustard, sweet tea Snack:  PB crackers Beverages:  water, sweet tea, juice    NUTRITION INTERVENTION  Nutrition education (E-1) on the following topics:   Initial Follow-up  [x]  []  Definition of Gestational Diabetes [x]  []  Why dietary management is important in controlling blood glucose [x]  []  Effects each nutrient has on blood glucose levels [x]  []  Simple carbohydrates vs complex carbohydrates [x]  []  Fluid intake [x]  []  Creating a balanced meal plan []  []  Carbohydrate counting  [x]  []  When to check blood glucose levels [x]  []  Proper blood glucose monitoring techniques [x]  []  Effect of stress and stress reduction techniques  [x]  []  Exercise effect on blood glucose levels, appropriate exercise during pregnancy []  []  Importance of limiting caffeine and abstaining from alcohol and smoking [x]  []  Medications used for blood sugar control during pregnancy [x]  []  Hypoglycemia and rule of 15 [x]  []  Postpartum  self care   AccuCheck Guide Me -  Patient has a meter prior to visit.  Postprandial: 127  Patient instructed to monitor glucose levels: FBS: 60 - <= 95 mg/dL; 2 hour: <= 433 mg/dL  Patient received handouts: Nutrition Diabetes and Pregnancy Blood glucose log Snack ideas for diabetes during pregnancy  Patient will be seen for follow-up as needed.

## 2022-12-06 NOTE — BH Specialist Note (Signed)
Integrated Behavioral Health via Telemedicine Visit  12/20/2022 Marissa Cross [id number]  Number of Integrated Behavioral Health Clinician visits: 3- Third Visit  Session Start time: 0850   Session End time: 0906  Total time in minutes: 16   Referring Provider: Tinnie Gens, MD Patient/Family location: Home [facility] Provider location: [facility]  All persons participating in visit: Patient Marissa Cross and Marissa Cross   Types of Service: Individual psychotherapy and Video visit  I connected with Marissa Cross and/or Marissa Cross's  n/a  via  Telephone or Video Enabled Telemedicine Application  (Video is Caregility application) and verified that I am speaking with the correct person using two identifiers. Discussed confidentiality: Yes   I discussed the limitations of telemedicine and the availability of in person appointments.  Discussed there is a possibility of technology failure and discussed alternative modes of communication if that failure occurs.  I discussed that engaging in this telemedicine visit, they consent to the provision of behavioral healthcare and the services will be billed under their insurance.  Patient and/or legal guardian expressed understanding and consented to Telemedicine visit: Yes   Presenting Concerns: Patient and/or family reports the following symptoms/concerns: Decreased anxiety; attributes to feeling more prepared for baby's arrival and accepting life stressors and changes; mild discomfort from carpel tunnel affecting sleep quality; overall mood improvement; no other concern at this time; family very supportive.  Duration of problem: Current pregnancy; Severity of problem: mild  Patient and/or Family's Strengths/Protective Factors: Social connections, Concrete supports in place (healthy food, safe environments, etc.), and Sense of purpose  Goals Addressed: Patient will:  Maintain reduced  symptoms of: anxiety    Demonstrate ability to: Increase healthy adjustment to current life circumstances  Progress towards Goals: Ongoing  Interventions: Interventions utilized:  Supportive Reflection Standardized Assessments completed: GAD-7 and PHQ 9  Patient and/or Family Response: Patient agrees with treatment plan.   Assessment: Patient currently experiencing Generalized anxiety disorder.   Patient may benefit from continued therapeutic intervention  .  Plan: Follow up with behavioral health clinician on : Call Marissa Cross at [phone], as needed. Behavioral recommendations:  -Continue prioritizing healthy self-care (regular meals, adequate rest; allowing practical help from supportive friends and family) until at least postpartum medical appointment -Consider new mom support group as needed at either www.postpartum.net or www.conehealthybaby.com after baby is born -Continue plan to involve children in helping prepare for baby's arrival; enjoy upcoming holiday season with family  Referral(s): Integrated [employer] (In Clinic)  I discussed the assessment and treatment plan with the patient and/or parent/guardian. They were provided an opportunity to ask questions and all were answered. They agreed with the plan and demonstrated an understanding of the instructions.   They were advised to call back or seek an in-person evaluation if the symptoms worsen or if the condition fails to improve as anticipated.  Marissa Lips, LCSW     12/20/2022    8:56 AM 08/20/2022    3:48 PM  Depression screen PHQ 2/9  Decreased Interest 0 1  Down, Depressed, Hopeless 0 1  PHQ - 2 Score 0 2  Altered sleeping 0 0  Tired, decreased energy 1 1  Change in appetite 0 0  Feeling bad or failure about yourself  0 0  Trouble concentrating 1 1  Moving slowly or fidgety/restless 0 1  Suicidal thoughts 0   PHQ-9 Score 2 5      12/20/2022    8:58 AM  08/20/2022    3:49 PM  GAD 7 :  Generalized Anxiety Score  Nervous, Anxious, on Edge 1 3  Control/stop worrying 0 2  Worry too much - different things 1 2  Trouble relaxing 0 1  Restless 0 1  Easily annoyed or irritable 1 1  Afraid - awful might happen 0 1  Total GAD 7 Score 3 11

## 2022-12-10 ENCOUNTER — Ambulatory Visit: Payer: Medicaid Other | Admitting: Family Medicine

## 2022-12-10 ENCOUNTER — Other Ambulatory Visit: Payer: Self-pay

## 2022-12-10 ENCOUNTER — Encounter: Payer: Self-pay | Admitting: Family Medicine

## 2022-12-10 ENCOUNTER — Encounter: Payer: Self-pay | Admitting: *Deleted

## 2022-12-10 VITALS — BP 100/60 | HR 105 | Wt 205.0 lb

## 2022-12-10 DIAGNOSIS — E669 Obesity, unspecified: Secondary | ICD-10-CM

## 2022-12-10 DIAGNOSIS — F411 Generalized anxiety disorder: Secondary | ICD-10-CM

## 2022-12-10 DIAGNOSIS — Z3A33 33 weeks gestation of pregnancy: Secondary | ICD-10-CM

## 2022-12-10 DIAGNOSIS — G40909 Epilepsy, unspecified, not intractable, without status epilepticus: Secondary | ICD-10-CM

## 2022-12-10 DIAGNOSIS — O99210 Obesity complicating pregnancy, unspecified trimester: Secondary | ICD-10-CM

## 2022-12-10 DIAGNOSIS — O283 Abnormal ultrasonic finding on antenatal screening of mother: Secondary | ICD-10-CM

## 2022-12-10 DIAGNOSIS — O0993 Supervision of high risk pregnancy, unspecified, third trimester: Secondary | ICD-10-CM

## 2022-12-10 DIAGNOSIS — O99213 Obesity complicating pregnancy, third trimester: Secondary | ICD-10-CM

## 2022-12-10 DIAGNOSIS — O285 Abnormal chromosomal and genetic finding on antenatal screening of mother: Secondary | ICD-10-CM

## 2022-12-10 DIAGNOSIS — O99353 Diseases of the nervous system complicating pregnancy, third trimester: Secondary | ICD-10-CM

## 2022-12-10 DIAGNOSIS — O34219 Maternal care for unspecified type scar from previous cesarean delivery: Secondary | ICD-10-CM

## 2022-12-10 DIAGNOSIS — O0990 Supervision of high risk pregnancy, unspecified, unspecified trimester: Secondary | ICD-10-CM

## 2022-12-10 DIAGNOSIS — O24410 Gestational diabetes mellitus in pregnancy, diet controlled: Secondary | ICD-10-CM

## 2022-12-10 NOTE — Patient Instructions (Signed)

## 2022-12-10 NOTE — Progress Notes (Signed)
FS 89 PP breakfast 117 Did not bring log.  Judeth Cornfield, RNC

## 2022-12-10 NOTE — Progress Notes (Signed)
   Subjective:  Marissa Cross is a 32 y.o. G3P2002 at [redacted]w[redacted]d being seen today for ongoing prenatal care.  She is currently monitored for the following issues for this high-risk pregnancy and has Mild dysplasia of cervix (CIN I); Supervision of high risk pregnancy, antepartum; Seizure disorder during pregnancy in third trimester (HCC); Previous cesarean delivery, antepartum; Headache in pregnancy, antepartum, third trimester; Obesity affecting pregnancy, antepartum; Echogenic bowel of fetus on prenatal ultrasound; Elevated SMA risk; Generalized anxiety disorder; and GDM (gestational diabetes mellitus) on their problem list.  Patient reports no complaints.  Contractions: Not present. Vag. Bleeding: None.  Movement: Present. Denies leaking of fluid.   The following portions of the patient's history were reviewed and updated as appropriate: allergies, current medications, past family history, past medical history, past social history, past surgical history and problem list. Problem list updated.  Objective:   Vitals:   12/10/22 1352  BP: 100/60  Pulse: (!) 105  Weight: 205 lb (93 kg)    Fetal Status: Fetal Heart Rate (bpm): 150   Movement: Present     General:  Alert, oriented and cooperative. Patient is in no acute distress.  Skin: Skin is warm and dry. No rash noted.   Cardiovascular: Normal heart rate noted  Respiratory: Normal respiratory effort, no problems with respiration noted  Abdomen: Soft, gravid, appropriate for gestational age. Pain/Pressure: Present     Pelvic: Vag. Bleeding: None     Cervical exam deferred        Extremities: Normal range of motion.     Mental Status: Normal mood and affect. Normal behavior. Normal judgment and thought content.   Urinalysis:      Assessment and Plan:  Pregnancy: G3P2002 at [redacted]w[redacted]d  1. Supervision of high risk pregnancy, antepartum BP and FHR normal Would like to defer TDAP and flu until after delivery Considering RSV vaccine, defers  today Considering BTL, consent signed today Having some numbness and tingling in her fingers, discussed likely carpal tunnel symptoms, reviewed bracing  2. Diet controlled gestational diabetes mellitus (GDM) in third trimester Did not bring log, on recall well controlled Last growth [place] 11/19/2022, EFW 66%, AC 75%, AFI 16  3. Seizure disorder during pregnancy in third trimester (HCC) On Zonisamide 300 mg at bedtime, reports no recent seizures  4. Previous cesarean delivery, antepartum Desires TOLAC, form signed previously but not scanned in, repeated today  5. Elevated SMA risk Has seen genetics, partner carrier screening was declined  6. Echogenic bowel of fetus on prenatal ultrasound Resolved on most recent ultrasound No evidence of acute infection with Toxo or CMV based on serologies  7. Generalized anxiety disorder F/w w Asher Muir  8. Obesity affecting pregnancy, antepartum, unspecified obesity type   Preterm labor symptoms and general obstetric precautions including but not limited to vaginal bleeding, contractions, leaking of fluid and fetal movement were reviewed in detail with the patient. Please refer to After Visit Summary for other counseling recommendations.  Return in 2 weeks (on 12/24/2022) for [facility], ob visit, needs MD.   Venora Maples, MD

## 2022-12-11 ENCOUNTER — Encounter: Payer: Medicaid Other | Admitting: Family Medicine

## 2022-12-16 ENCOUNTER — Encounter: Payer: Self-pay | Admitting: *Deleted

## 2022-12-17 ENCOUNTER — Other Ambulatory Visit (HOSPITAL_COMMUNITY)
Admission: RE | Admit: 2022-12-17 | Discharge: 2022-12-17 | Disposition: A | Discharge status: Home / Self Care | Payer: Medicaid Other | Source: Ambulatory Visit | Attending: Family Medicine | Admitting: Family Medicine

## 2022-12-17 ENCOUNTER — Other Ambulatory Visit: Payer: Self-pay

## 2022-12-17 ENCOUNTER — Ambulatory Visit: Payer: Medicaid Other

## 2022-12-17 ENCOUNTER — Other Ambulatory Visit: Payer: Self-pay | Admitting: Family Medicine

## 2022-12-17 VITALS — BP 106/63 | HR 104 | Wt 204.0 lb

## 2022-12-17 DIAGNOSIS — Z23 Encounter for immunization: Secondary | ICD-10-CM | POA: Diagnosis not present

## 2022-12-17 DIAGNOSIS — N898 Other specified noninflammatory disorders of vagina: Secondary | ICD-10-CM | POA: Insufficient documentation

## 2022-12-17 DIAGNOSIS — Z3A34 34 weeks gestation of pregnancy: Secondary | ICD-10-CM

## 2022-12-17 DIAGNOSIS — R21 Rash and other nonspecific skin eruption: Secondary | ICD-10-CM

## 2022-12-17 DIAGNOSIS — R300 Dysuria: Secondary | ICD-10-CM

## 2022-12-17 DIAGNOSIS — O0993 Supervision of high risk pregnancy, unspecified, third trimester: Secondary | ICD-10-CM

## 2022-12-17 DIAGNOSIS — O0990 Supervision of high risk pregnancy, unspecified, unspecified trimester: Secondary | ICD-10-CM

## 2022-12-17 LAB — POCT URINALYSIS DIP (DEVICE)
Bilirubin Urine: NEGATIVE
Glucose, UA: NEGATIVE mg/dL
Ketones, ur: NEGATIVE mg/dL
Leukocytes,Ua: NEGATIVE
Nitrite: NEGATIVE
Protein, ur: NEGATIVE mg/dL
Specific Gravity, Urine: 1.015 (ref 1.005–1.030)
Urobilinogen, UA: 1 mg/dL (ref 0.0–1.0)
pH: 6.5 (ref 5.0–8.0)

## 2022-12-17 NOTE — Progress Notes (Signed)
Here at 34 weeks of pregnancy with concern of vaginal itching and irritation present for 1 week. Reports a rash consisting of small bumps directly outside of L labia that have now started to heal. Feels similar to rash she has experienced after shaving, but denies recent shaving. She is unable to tell if they have a blister appearance. No abnormal vaginal discharge. Reports burning with urination, unable to determine location of burning but knows this occurs mostly with urination. History of oral cold sores; otherwise no history of herpes outbreak. Reviewed with Crissie Reese MD who recommends vaginal self swab including viral culture for HSV. UA shows trace hemoglobin, otherwise normal; reviewed with Crissie Reese, MD who does not recommend any additional testing.  Would like RSV vaccine today. Previously deferred flu and tdap until after birth but is now considering these vaccines at her next appt.  Fleet Contras RN 12/17/22

## 2022-12-19 LAB — HERPES SIMPLEX VIRUS CULTURE

## 2022-12-20 ENCOUNTER — Other Ambulatory Visit: Payer: Self-pay | Admitting: Family Medicine

## 2022-12-20 ENCOUNTER — Ambulatory Visit: Payer: Medicaid Other | Admitting: Clinical

## 2022-12-20 DIAGNOSIS — F411 Generalized anxiety disorder: Secondary | ICD-10-CM | POA: Diagnosis not present

## 2022-12-20 LAB — CERVICOVAGINAL ANCILLARY ONLY
Bacterial Vaginitis (gardnerella): NEGATIVE
Candida Glabrata: NEGATIVE
Candida Vaginitis: POSITIVE — AB
Chlamydia: NEGATIVE
Comment: NEGATIVE
Comment: NEGATIVE
Comment: NEGATIVE
Comment: NEGATIVE
Comment: NEGATIVE
Comment: NORMAL
Neisseria Gonorrhea: NEGATIVE
Trichomonas: NEGATIVE

## 2022-12-20 MED ORDER — FLUCONAZOLE 150 MG PO TABS: 150.0000 mg | ORAL_TABLET | Freq: Once | ORAL | 0 refills | Status: AC

## 2022-12-20 NOTE — Patient Instructions (Signed)
[facility] [address] [place], [place] [address] [phone] (main office) [phone] (Rodney Yera's office)  New Parent Support Groups www.postpartum.net www.conehealthybaby.com  [place] [employer]  (Childcare options, Early childcare development, etc.) www.[place].org  [facility] Search Engine  https://[place]childcare.ncdhhs.gov/childcaresearch  

## 2022-12-23 ENCOUNTER — Encounter: Payer: Medicaid Other | Admitting: Family Medicine

## 2022-12-24 ENCOUNTER — Ambulatory Visit: Payer: Medicaid Other | Attending: Obstetrics

## 2022-12-24 ENCOUNTER — Ambulatory Visit (INDEPENDENT_AMBULATORY_CARE_PROVIDER_SITE_OTHER): Payer: Medicaid Other | Admitting: Family Medicine

## 2022-12-24 ENCOUNTER — Other Ambulatory Visit: Payer: Self-pay

## 2022-12-24 ENCOUNTER — Other Ambulatory Visit: Payer: Self-pay | Admitting: Obstetrics

## 2022-12-24 VITALS — BP 117/66 | HR 104 | Wt 202.0 lb

## 2022-12-24 DIAGNOSIS — E669 Obesity, unspecified: Secondary | ICD-10-CM

## 2022-12-24 DIAGNOSIS — O24419 Gestational diabetes mellitus in pregnancy, unspecified control: Secondary | ICD-10-CM | POA: Insufficient documentation

## 2022-12-24 DIAGNOSIS — O34219 Maternal care for unspecified type scar from previous cesarean delivery: Secondary | ICD-10-CM

## 2022-12-24 DIAGNOSIS — G40909 Epilepsy, unspecified, not intractable, without status epilepticus: Secondary | ICD-10-CM

## 2022-12-24 DIAGNOSIS — O26823 Pregnancy related peripheral neuritis, third trimester: Secondary | ICD-10-CM

## 2022-12-24 DIAGNOSIS — Z148 Genetic carrier of other disease: Secondary | ICD-10-CM

## 2022-12-24 DIAGNOSIS — O99343 Other mental disorders complicating pregnancy, third trimester: Secondary | ICD-10-CM

## 2022-12-24 DIAGNOSIS — O35DXX0 Maternal care for other (suspected) fetal abnormality and damage, fetal gastrointestinal anomalies, not applicable or unspecified: Secondary | ICD-10-CM | POA: Diagnosis present

## 2022-12-24 DIAGNOSIS — Z3A35 35 weeks gestation of pregnancy: Secondary | ICD-10-CM

## 2022-12-24 DIAGNOSIS — O99213 Obesity complicating pregnancy, third trimester: Secondary | ICD-10-CM

## 2022-12-24 DIAGNOSIS — L299 Pruritus, unspecified: Secondary | ICD-10-CM

## 2022-12-24 DIAGNOSIS — F411 Generalized anxiety disorder: Secondary | ICD-10-CM

## 2022-12-24 DIAGNOSIS — O0990 Supervision of high risk pregnancy, unspecified, unspecified trimester: Secondary | ICD-10-CM

## 2022-12-24 DIAGNOSIS — O99210 Obesity complicating pregnancy, unspecified trimester: Secondary | ICD-10-CM

## 2022-12-24 DIAGNOSIS — O99353 Diseases of the nervous system complicating pregnancy, third trimester: Secondary | ICD-10-CM

## 2022-12-24 DIAGNOSIS — O2686 Pruritic urticarial papules and plaques of pregnancy (PUPPP): Secondary | ICD-10-CM

## 2022-12-24 DIAGNOSIS — O285 Abnormal chromosomal and genetic finding on antenatal screening of mother: Secondary | ICD-10-CM

## 2022-12-24 DIAGNOSIS — G5603 Carpal tunnel syndrome, bilateral upper limbs: Secondary | ICD-10-CM

## 2022-12-24 NOTE — Addendum Note (Signed)
Addended by: Celedonio Savage on: 12/24/2022 02:34 PM   Modules accepted: Orders

## 2022-12-24 NOTE — Progress Notes (Addendum)
   PRENATAL VISIT NOTE  Subjective:  Marissa Cross is a 32 y.o. G3P2002 at [redacted]w[redacted]d being seen today for ongoing prenatal care.  She is currently monitored for the following issues for this high-risk pregnancy and has Mild dysplasia of cervix (CIN I); Supervision of high risk pregnancy, antepartum; Seizure disorder during pregnancy in third trimester (HCC); Previous cesarean delivery, antepartum; Headache in pregnancy, antepartum, third trimester; Obesity affecting pregnancy, antepartum; Echogenic bowel of fetus on prenatal ultrasound; Elevated SMA risk; Generalized anxiety disorder; and GDM (gestational diabetes mellitus) on their problem list.  Patient reports carpal tunnel symptoms, no bleeding, no cramping, no leaking, and occasional contractions.  Contractions: Not present. Vag. Bleeding: None.  Movement: Present. Denies leaking of fluid.   The following portions of the patient's history were reviewed and updated as appropriate: allergies, current medications, past family history, past medical history, past social history, past surgical history and problem list.   Objective:   Vitals:   12/24/22 1353  BP: 117/66  Pulse: (!) 104  Weight: 202 lb (91.6 kg)    Fetal Status: Fetal Heart Rate (bpm): 134   Movement: Present     General:  Alert, oriented and cooperative. Patient is in no acute distress.  Skin: Skin is warm and dry. No rash noted.   Cardiovascular: Normal heart rate noted  Respiratory: Normal respiratory effort, no problems with respiration noted  Abdomen: Soft, gravid, appropriate for gestational age.  Pain/Pressure: Absent     Pelvic: Cervical exam deferred        Extremities: Normal range of motion.  Edema: None  Mental Status: Normal mood and affect. Normal behavior. Normal judgment and thought content.   Assessment and Plan:  Pregnancy: G3P2002 at [redacted]w[redacted]d 1. Supervision of high risk pregnancy, antepartum FHR and BP appropriate today  2. Generalized anxiety  disorder Follows with [name]  3. Gestational diabetes mellitus (GDM), antepartum, gestational diabetes method of control unspecified Patient reports fasting blood sugars between 80s to 100.  A lot of postprandials in the 120s but did not bring log.  Does not checking blood sugars regularly.  Discussed making sure she checks 4 times a day and bring the log to her next visit and patient is agreeable Scheduled for growth ultrasound today   4. Previous cesarean delivery, antepartum Desires trial of labor TOLAC papers signed previously  5. Seizure disorder during pregnancy in third trimester [facility]) Follows with neurology  6. Obesity affecting pregnancy, antepartum, unspecified obesity type Continue ASA  7. Bilateral carpal tunnel syndrome Continue wrist splints  8. [redacted] weeks gestation of pregnancy Will need swabs at next visit  9. Itching ' Patient reports that she has had increased itching over the last week worse on hands and feet as well as on her stomach.  Worse at night.  Will collect bile acids and CMP to rule out cholestasis of pregnancy.  Preterm labor symptoms and general obstetric precautions including but not limited to vaginal bleeding, contractions, leaking of fluid and fetal movement were reviewed in detail with the patient. Please refer to After Visit Summary for other counseling recommendations.   No follow-ups on file.  Future Appointments  Date Time Provider Department Center  12/24/2022  3:30 PM [facility] US5 [facility]  02/14/2023 11:30 AM Van Clines, MD [facility] None    Celedonio Savage, MD

## 2022-12-25 LAB — COMPREHENSIVE METABOLIC PANEL
ALT: 13 [IU]/L (ref 0–32)
AST: 15 [IU]/L (ref 0–40)
Albumin: 3.4 g/dL — ABNORMAL LOW (ref 3.9–4.9)
Alkaline Phosphatase: 97 [IU]/L (ref 44–121)
BUN/Creatinine Ratio: 4 — ABNORMAL LOW (ref 9–23)
BUN: 2 mg/dL — ABNORMAL LOW (ref 6–20)
Bilirubin Total: 0.3 mg/dL (ref 0.0–1.2)
CO2: 22 mmol/L (ref 20–29)
Calcium: 8.9 mg/dL (ref 8.7–10.2)
Chloride: 106 mmol/L (ref 96–106)
Creatinine, Ser: 0.47 mg/dL — ABNORMAL LOW (ref 0.57–1.00)
Globulin, Total: 2.2 g/dL (ref 1.5–4.5)
Glucose: 94 mg/dL (ref 70–99)
Potassium: 3.3 mmol/L — ABNORMAL LOW (ref 3.5–5.2)
Sodium: 139 mmol/L (ref 134–144)
Total Protein: 5.6 g/dL — ABNORMAL LOW (ref 6.0–8.5)
eGFR: 129 mL/min/{1.73_m2} (ref >59)

## 2022-12-25 LAB — BILE ACIDS, TOTAL: Bile Acids Total: 1.6 umol/L (ref 0.0–10.0)

## 2023-01-03 ENCOUNTER — Telehealth (HOSPITAL_COMMUNITY): Payer: Self-pay | Admitting: *Deleted

## 2023-01-03 ENCOUNTER — Encounter: Payer: Self-pay | Admitting: Family Medicine

## 2023-01-03 ENCOUNTER — Encounter (HOSPITAL_COMMUNITY): Payer: Self-pay

## 2023-01-03 ENCOUNTER — Other Ambulatory Visit: Payer: Self-pay | Admitting: Family Medicine

## 2023-01-03 ENCOUNTER — Ambulatory Visit (INDEPENDENT_AMBULATORY_CARE_PROVIDER_SITE_OTHER): Payer: Medicaid Other | Admitting: Family Medicine

## 2023-01-03 ENCOUNTER — Other Ambulatory Visit (HOSPITAL_COMMUNITY)
Admission: RE | Admit: 2023-01-03 | Discharge: 2023-01-03 | Disposition: A | Discharge status: Home / Self Care | Payer: Medicaid Other | Source: Ambulatory Visit | Attending: Family Medicine | Admitting: Family Medicine

## 2023-01-03 VITALS — BP 111/72 | HR 111 | Wt 205.1 lb

## 2023-01-03 DIAGNOSIS — O283 Abnormal ultrasonic finding on antenatal screening of mother: Secondary | ICD-10-CM

## 2023-01-03 DIAGNOSIS — O0990 Supervision of high risk pregnancy, unspecified, unspecified trimester: Secondary | ICD-10-CM

## 2023-01-03 DIAGNOSIS — O0993 Supervision of high risk pregnancy, unspecified, third trimester: Secondary | ICD-10-CM

## 2023-01-03 DIAGNOSIS — E669 Obesity, unspecified: Secondary | ICD-10-CM

## 2023-01-03 DIAGNOSIS — Z3A36 36 weeks gestation of pregnancy: Secondary | ICD-10-CM

## 2023-01-03 DIAGNOSIS — O99210 Obesity complicating pregnancy, unspecified trimester: Secondary | ICD-10-CM

## 2023-01-03 DIAGNOSIS — O26643 Intrahepatic cholestasis of pregnancy, third trimester: Secondary | ICD-10-CM

## 2023-01-03 DIAGNOSIS — O99213 Obesity complicating pregnancy, third trimester: Secondary | ICD-10-CM

## 2023-01-03 DIAGNOSIS — G40909 Epilepsy, unspecified, not intractable, without status epilepticus: Secondary | ICD-10-CM

## 2023-01-03 DIAGNOSIS — O26649 Intrahepatic cholestasis of pregnancy, unspecified trimester: Secondary | ICD-10-CM | POA: Insufficient documentation

## 2023-01-03 DIAGNOSIS — O24410 Gestational diabetes mellitus in pregnancy, diet controlled: Secondary | ICD-10-CM

## 2023-01-03 DIAGNOSIS — O285 Abnormal chromosomal and genetic finding on antenatal screening of mother: Secondary | ICD-10-CM

## 2023-01-03 DIAGNOSIS — F411 Generalized anxiety disorder: Secondary | ICD-10-CM

## 2023-01-03 DIAGNOSIS — O34219 Maternal care for unspecified type scar from previous cesarean delivery: Secondary | ICD-10-CM

## 2023-01-03 DIAGNOSIS — O99353 Diseases of the nervous system complicating pregnancy, third trimester: Secondary | ICD-10-CM

## 2023-01-03 HISTORY — DX: Intrahepatic cholestasis of pregnancy, unspecified trimester: O26.649

## 2023-01-03 NOTE — Progress Notes (Signed)
   Subjective:  Marissa Cross is a 32 y.o. G3P2002 at [redacted]w[redacted]d being seen today for ongoing prenatal care.  She is currently monitored for the following issues for this high-risk pregnancy and has Mild dysplasia of cervix (CIN I); Supervision of high risk pregnancy, antepartum; Seizure disorder during pregnancy in third trimester (HCC); Previous cesarean delivery, antepartum; Headache in pregnancy, antepartum, third trimester; Obesity affecting pregnancy, antepartum; Elevated SMA risk; Generalized anxiety disorder; and GDM (gestational diabetes mellitus) on their problem list.  Patient reports no complaints.  Contractions: Irritability. Vag. Bleeding: None.  Movement: Present. Denies leaking of fluid.   The following portions of the patient's history were reviewed and updated as appropriate: allergies, current medications, past family history, past medical history, past social history, past surgical history and problem list. Problem list updated.  Objective:   Vitals:   01/03/23 1032  BP: 111/72  Pulse: (!) 111  Weight: 205 lb 1 oz (93 kg)    Fetal Status: Fetal Heart Rate (bpm): 138   Movement: Present     General:  Alert, oriented and cooperative. Patient is in no acute distress.  Skin: Skin is warm and dry. No rash noted.   Cardiovascular: Normal heart rate noted  Respiratory: Normal respiratory effort, no problems with respiration noted  Abdomen: Soft, gravid, appropriate for gestational age. Pain/Pressure: Present     Pelvic: Vag. Bleeding: None     Cervical exam deferred        Extremities: Normal range of motion.  Edema: Trace  Mental Status: Normal mood and affect. Normal behavior. Normal judgment and thought content.   Urinalysis:      Assessment and Plan:  Pregnancy: G3P2002 at [redacted]w[redacted]d  1. Supervision of high risk pregnancy, antepartum (Primary) BP and FHR normal Reported itching at last visit, bile acids/CMP unremarkable. Today reports ongoing palmar and foot sole itching.  Empiric diagnosis of cholestasis combined with poorly controlled sugars (see below), will schedule for IOL at 37 weeks Form faxed, orders placed Swabs collected today by female partner per patien preference  2. Previous cesarean delivery, antepartum VD>stat CS for fetal bradycardia with abruption found at time of surgery Desires TOLAC, signed previously   3. Seizure disorder during pregnancy in third trimester [facility]) Follows w Neuro, last seen 10/14/2022 Stable on zonisamide  4. Obesity affecting pregnancy, antepartum, unspecified obesity type   5. Generalized anxiety disorder Following w Asher Muir  6. Diet controlled gestational diabetes mellitus (GDM) in third trimester Did not bring log On recall reports uncontrolled sugars, post prandials in the 140's Last growth [place] 12/24/2022, EFW 42%, 2526g, AC 57%, AFI 13 Discussed timing of delivery, this is more information in favor of 37 wk deliery  7. Elevated SMA risk   8. Echogenic bowel of fetus on prenatal ultrasound Resolved on follow up scan Resolved from PL  Preterm labor symptoms and general obstetric precautions including but not limited to vaginal bleeding, contractions, leaking of fluid and fetal movement were reviewed in detail with the patient. Please refer to After Visit Summary for other counseling recommendations.  Return in 1 week (on 01/10/2023) for [facility], ob visit, needs MD.   Venora Maples, MD

## 2023-01-03 NOTE — Telephone Encounter (Signed)
Preadmission screen  

## 2023-01-03 NOTE — Patient Instructions (Signed)

## 2023-01-06 ENCOUNTER — Encounter (HOSPITAL_COMMUNITY): Payer: Self-pay | Admitting: *Deleted

## 2023-01-06 ENCOUNTER — Telehealth (HOSPITAL_COMMUNITY): Payer: Self-pay | Admitting: *Deleted

## 2023-01-06 LAB — CULTURE, BETA STREP (GROUP B ONLY): Strep Gp B Culture: NEGATIVE

## 2023-01-06 NOTE — Telephone Encounter (Signed)
Preadmission screen  

## 2023-01-07 ENCOUNTER — Inpatient Hospital Stay (HOSPITAL_COMMUNITY): Payer: Medicaid Other

## 2023-01-07 ENCOUNTER — Other Ambulatory Visit: Payer: Self-pay

## 2023-01-07 ENCOUNTER — Inpatient Hospital Stay (HOSPITAL_COMMUNITY)
Admission: RE | Admit: 2023-01-07 | Discharge: 2023-01-10 | DRG: 806 | Disposition: A | Discharge status: Home / Self Care | Payer: Medicaid Other | Attending: Family Medicine | Admitting: Family Medicine

## 2023-01-07 ENCOUNTER — Encounter (HOSPITAL_COMMUNITY): Payer: Self-pay | Admitting: Obstetrics & Gynecology

## 2023-01-07 DIAGNOSIS — O99354 Diseases of the nervous system complicating childbirth: Secondary | ICD-10-CM | POA: Diagnosis present

## 2023-01-07 DIAGNOSIS — O34211 Maternal care for low transverse scar from previous cesarean delivery: Secondary | ICD-10-CM | POA: Diagnosis not present

## 2023-01-07 DIAGNOSIS — O285 Abnormal chromosomal and genetic finding on antenatal screening of mother: Secondary | ICD-10-CM | POA: Diagnosis present

## 2023-01-07 DIAGNOSIS — O99210 Obesity complicating pregnancy, unspecified trimester: Secondary | ICD-10-CM | POA: Diagnosis present

## 2023-01-07 DIAGNOSIS — O99214 Obesity complicating childbirth: Secondary | ICD-10-CM | POA: Diagnosis present

## 2023-01-07 DIAGNOSIS — G40909 Epilepsy, unspecified, not intractable, without status epilepticus: Secondary | ICD-10-CM | POA: Diagnosis present

## 2023-01-07 DIAGNOSIS — K7689 Other specified diseases of liver: Secondary | ICD-10-CM | POA: Diagnosis present

## 2023-01-07 DIAGNOSIS — O0990 Supervision of high risk pregnancy, unspecified, unspecified trimester: Secondary | ICD-10-CM

## 2023-01-07 DIAGNOSIS — G5603 Carpal tunnel syndrome, bilateral upper limbs: Secondary | ICD-10-CM | POA: Diagnosis present

## 2023-01-07 DIAGNOSIS — Z87410 Personal history of cervical dysplasia: Secondary | ICD-10-CM | POA: Diagnosis not present

## 2023-01-07 DIAGNOSIS — O26643 Intrahepatic cholestasis of pregnancy, third trimester: Secondary | ICD-10-CM | POA: Diagnosis present

## 2023-01-07 DIAGNOSIS — Z3490 Encounter for supervision of normal pregnancy, unspecified, unspecified trimester: Principal | ICD-10-CM | POA: Diagnosis present

## 2023-01-07 DIAGNOSIS — F411 Generalized anxiety disorder: Secondary | ICD-10-CM | POA: Diagnosis present

## 2023-01-07 DIAGNOSIS — O34219 Maternal care for unspecified type scar from previous cesarean delivery: Secondary | ICD-10-CM | POA: Diagnosis present

## 2023-01-07 DIAGNOSIS — O26649 Intrahepatic cholestasis of pregnancy, unspecified trimester: Secondary | ICD-10-CM | POA: Diagnosis present

## 2023-01-07 DIAGNOSIS — O24420 Gestational diabetes mellitus in childbirth, diet controlled: Secondary | ICD-10-CM | POA: Diagnosis present

## 2023-01-07 DIAGNOSIS — R519 Headache, unspecified: Secondary | ICD-10-CM | POA: Diagnosis present

## 2023-01-07 DIAGNOSIS — Z3A37 37 weeks gestation of pregnancy: Secondary | ICD-10-CM | POA: Diagnosis not present

## 2023-01-07 DIAGNOSIS — E7879 Other disorders of bile acid and cholesterol metabolism: Secondary | ICD-10-CM | POA: Diagnosis present

## 2023-01-07 DIAGNOSIS — M7989 Other specified soft tissue disorders: Secondary | ICD-10-CM | POA: Diagnosis not present

## 2023-01-07 DIAGNOSIS — O2662 Liver and biliary tract disorders in childbirth: Secondary | ICD-10-CM | POA: Diagnosis not present

## 2023-01-07 DIAGNOSIS — O24419 Gestational diabetes mellitus in pregnancy, unspecified control: Secondary | ICD-10-CM | POA: Diagnosis present

## 2023-01-07 DIAGNOSIS — O26893 Other specified pregnancy related conditions, third trimester: Secondary | ICD-10-CM | POA: Diagnosis present

## 2023-01-07 LAB — COMPREHENSIVE METABOLIC PANEL
ALT: 19 U/L (ref 0–44)
AST: 23 U/L (ref 15–41)
Albumin: 2.8 g/dL — ABNORMAL LOW (ref 3.5–5.0)
Alkaline Phosphatase: 82 U/L (ref 38–126)
Anion gap: 11 (ref 5–15)
BUN: <5 mg/dL — ABNORMAL LOW (ref 6–20)
CO2: 20 mmol/L — ABNORMAL LOW (ref 22–32)
Calcium: 8.6 mg/dL — ABNORMAL LOW (ref 8.9–10.3)
Chloride: 108 mmol/L (ref 98–111)
Creatinine, Ser: 0.51 mg/dL (ref 0.44–1.00)
GFR, Estimated: >60 mL/min (ref >60)
Glucose, Bld: 93 mg/dL (ref 70–99)
Potassium: 2.9 mmol/L — ABNORMAL LOW (ref 3.5–5.1)
Sodium: 139 mmol/L (ref 135–145)
Total Bilirubin: 0.6 mg/dL (ref <1.2)
Total Protein: 5 g/dL — ABNORMAL LOW (ref 6.5–8.1)

## 2023-01-07 LAB — GC/CHLAMYDIA PROBE AMP (~~LOC~~) NOT AT ARMC
Chlamydia: NEGATIVE
Comment: NEGATIVE
Comment: NORMAL
Neisseria Gonorrhea: NEGATIVE

## 2023-01-07 LAB — CBC
HCT: 31.4 % — ABNORMAL LOW (ref 36.0–46.0)
Hemoglobin: 10.8 g/dL — ABNORMAL LOW (ref 12.0–15.0)
MCH: 30.3 pg (ref 26.0–34.0)
MCHC: 34.4 g/dL (ref 30.0–36.0)
MCV: 88 fL (ref 80.0–100.0)
Platelets: 175 10*3/uL (ref 150–400)
RBC: 3.57 MIL/uL — ABNORMAL LOW (ref 3.87–5.11)
RDW: 14.6 % (ref 11.5–15.5)
WBC: 4.7 10*3/uL (ref 4.0–10.5)
nRBC: 0 % (ref 0.0–0.2)

## 2023-01-07 LAB — TYPE AND SCREEN
ABO/RH(D): O POS
Antibody Screen: NEGATIVE

## 2023-01-07 LAB — GLUCOSE, CAPILLARY
Glucose-Capillary: 78 mg/dL (ref 70–99)
Glucose-Capillary: 69 mg/dL — ABNORMAL LOW (ref 70–99)

## 2023-01-07 MED ORDER — ONDANSETRON HCL 4 MG/2ML IJ SOLN: 4.0000 mg | Freq: Four times a day (QID) | INTRAMUSCULAR | Status: DC | PRN

## 2023-01-07 MED ORDER — TERBUTALINE SULFATE 1 MG/ML IJ SOLN: 0.2500 mg | Freq: Once | INTRAMUSCULAR | Status: DC | PRN

## 2023-01-07 MED ORDER — LACTATED RINGERS IV SOLN: INTRAVENOUS | Status: DC

## 2023-01-07 MED ORDER — OXYTOCIN-SODIUM CHLORIDE 30-0.9 UT/500ML-% IV SOLN
1.0000 m[IU]/min | INTRAVENOUS | Status: DC
Start: 2023-01-07 — End: 2023-01-08
  Administered 2023-01-07: 2 m[IU]/min via INTRAVENOUS
  Filled 2023-01-07 (×2): qty 500

## 2023-01-07 MED ORDER — ACETAMINOPHEN 325 MG PO TABS
650.0000 mg | ORAL_TABLET | ORAL | Status: DC | PRN
  Administered 2023-01-08: 650 mg via ORAL
  Filled 2023-01-07: qty 2

## 2023-01-07 MED ORDER — SOD CITRATE-CITRIC ACID 500-334 MG/5ML PO SOLN: 30.0000 mL | ORAL | Status: DC | PRN

## 2023-01-07 MED ORDER — OXYTOCIN-SODIUM CHLORIDE 30-0.9 UT/500ML-% IV SOLN: 2.5000 [IU]/h | INTRAVENOUS | Status: DC

## 2023-01-07 MED ORDER — OXYTOCIN BOLUS FROM INFUSION
333.0000 mL | Freq: Once | INTRAVENOUS | Status: AC
  Administered 2023-01-08: 333 mL via INTRAVENOUS

## 2023-01-07 MED ORDER — POTASSIUM CHLORIDE CRYS ER 20 MEQ PO TBCR
40.0000 meq | EXTENDED_RELEASE_TABLET | Freq: Two times a day (BID) | ORAL | Status: DC
  Administered 2023-01-07: 40 meq via ORAL
  Filled 2023-01-07 (×3): qty 2

## 2023-01-07 MED ORDER — LIDOCAINE HCL (PF) 1 % IJ SOLN: 30.0000 mL | INTRAMUSCULAR | Status: DC | PRN

## 2023-01-07 MED ORDER — FENTANYL CITRATE (PF) 100 MCG/2ML IJ SOLN
50.0000 ug | INTRAMUSCULAR | Status: DC | PRN
  Administered 2023-01-07: 50 ug via INTRAVENOUS
  Administered 2023-01-08: 100 ug via INTRAVENOUS
  Filled 2023-01-07 (×2): qty 2

## 2023-01-07 NOTE — H&P (Signed)
LABOR AND DELIVERY ADMISSION HISTORY AND PHYSICAL NOTE  Marissa Cross is a 32 y.o. female G60P2002 with IUP at [redacted]w[redacted]d presenting for IOL 2/2 cholestasis, A1GDM. Pregnancy c/b obesity, prior C/S, seizure disorder.  Patient reports the fetal movement as active. Patient reports uterine contraction  activity as rare. Patient reports  vaginal bleeding as none. Patient describes fluid per vagina as None.   Patient denies headache, vision changes, chest pain, shortness of breath, right upper quadrant pain, or LE edema.  She plans on breast feeding and bottle feeding feeding. Her contraception plan is: bilateral tubal ligation.  Prenatal History/Complications: PNC at [facility] Hx NSVD, followed by C/S for placental abruption  Sono:  @[redacted]w[redacted]d , CWD, normal anatomy, cephalic presentation, posterior placenta, 42%ile, EFW 2526 g  Pregnancy complications:  Patient Active Problem List   Diagnosis Date Noted   Encounter for induction of labor 01/07/2023   Cholestasis during pregnancy in third trimester 01/07/2023   Cholestasis of pregnancy 01/03/2023   GDM (gestational diabetes mellitus) 11/26/2022   Elevated SMA risk 11/08/2022   Generalized anxiety disorder 11/08/2022   Obesity affecting pregnancy, antepartum 10/30/2022   Headache in pregnancy, antepartum, third trimester 10/23/2022   Previous cesarean delivery, antepartum 08/20/2022   Supervision of high risk pregnancy, antepartum 08/14/2022   Seizure disorder during pregnancy in third trimester (HCC) 08/14/2022   Mild dysplasia of cervix (CIN I) 09/16/2014    Past Medical History: Past Medical History:  Diagnosis Date   GDM (gestational diabetes mellitus) 11/2022   Placental abruption affecting delivery 10/15/2016   Seizure (HCC)    Vaginal Pap smear, abnormal     Past Surgical History: Past Surgical History:  Procedure Laterality Date   CESAREAN SECTION N/A 10/15/2016   Procedure: CESAREAN SECTION;  Surgeon: Shea Evans, MD;  Location:  [facility];  Service: Obstetrics;  Laterality: N/A;    Obstetrical History: OB History     Gravida  3   Para  2   Term  2   Preterm  0   AB  0   Living  2      SAB  0   IAB  0   Ectopic  0   Multiple  0   Live Births  2           Social History: Social History   Socioeconomic History   Marital status: Single    Spouse name: Not on file   Number of children: Not on file   Years of education: Not on file   Highest education level: Not on file  Occupational History   Not on file  Tobacco Use   Smoking status: Never   Smokeless tobacco: Never  Vaping Use   Vaping status: Never Used  Substance and Sexual Activity   Alcohol use: Not Currently    Comment: holidays only; not while preg   Drug use: No   Sexual activity: Yes    Birth control/protection: None  Other Topics Concern   Not on file  Social History Narrative   ** Merged History Encounter **   Right handed     Caffeine 1 cup daily    Home is one level   Social Drivers of Corporate investment banker Strain: Not on file  Food Insecurity: Not on file  Transportation Needs: Not on file  Physical Activity: Not on file  Stress: Not on file  Social Connections: Unknown (05/28/2022)   Received from [facility], [facility] Short Social Needs Screening -  Social Connection    Would you like help with any of the following needs: food, medicine/medical supplies, transportation, loneliness, housing or utilities?: Not on file    Family History: Family History  Problem Relation Age of Onset   Diabetes Neg Hx    Hyperlipidemia Neg Hx     Allergies: No Known Allergies  Medications Prior to Admission  Medication Sig Dispense Refill Last Dose/Taking   Accu-Chek Softclix Lancets lancets Use as instructed four times per day 100 each 12 Past Week   cyclobenzaprine (FLEXERIL) 10 MG tablet Take 1 tablet (10 mg total) by mouth every 8 (eight) hours as needed for muscle spasms.  (Patient not taking: Reported on 01/03/2023) 30 tablet 1    glucose blood (ACCU-CHEK GUIDE) test strip Use as instructed four times per day 100 each 12 Past Week   hydrOXYzine (VISTARIL) 25 MG capsule Take 1 capsule (25 mg total) by mouth 3 (three) times daily as needed. (Patient not taking: Reported on 01/03/2023) 30 capsule 0    Magnesium Oxide 400 MG CAPS Take 1 tablet daily (Patient not taking: Reported on 01/03/2023) 90 capsule 3    ondansetron (ZOFRAN ODT) 4 MG disintegrating tablet Take 1 tablet (4 mg total) by mouth every 8 (eight) hours as needed for nausea or vomiting. (Patient not taking: Reported on 01/03/2023) 20 tablet 0    prenatal vitamin w/FE, FA (PRENATAL 1 + 1) 27-1 MG TABS tablet Take 1 tablet by mouth daily at 12 noon. 90 tablet 3 01/06/2023   pyridOXINE (VITAMIN B6) 100 MG tablet Take 100 mg by mouth daily.   Past Month   Riboflavin 400 MG CAPS Take 1 capsule daily (Patient not taking: Reported on 01/03/2023) 90 capsule 3    zonisamide (ZONEGRAN) 100 MG capsule Take 3 capsules every night 270 capsule 3 Past Week     Review of Systems  All systems reviewed and negative except as stated in HPI  Physical Exam LMP 04/29/2022 (Approximate)   Physical Exam Constitutional:      General: She is not in acute distress.    Appearance: She is not ill-appearing.  Cardiovascular:     Rate and Rhythm: Normal rate and regular rhythm.  Pulmonary:     Effort: Pulmonary effort is normal.  Abdominal:     Comments: Gravid  Skin:    General: Skin is warm and dry.  Neurological:     General: No focal deficit present.  Psychiatric:        Mood and Affect: Mood normal.   Presentation: cephalic by BSUS  Fetal monitoring: Baseline: 140 bpm, Variability: Good {> 6 bpm), Accelerations: Reactive, and Decelerations: Absent Uterine activity: UI  Dilation: Closed Effacement (%): Thick Station: Ballotable Presentation: Vertex Exam by:: Dr. Earlene Plater  Prenatal labs: ABO, Rh:  --/--/PENDING (12/24 1634) Antibody: PENDING (12/24 1634) Rubella: Immune (07/15 0000) RPR: Non Reactive (11/08 0900)  HBsAg: Negative (07/15 0000)  HIV: Non Reactive (11/08 0900)  GC/Chlamydia:  Neisseria Gonorrhea  Date Value Ref Range Status  01/03/2023 Negative  Final   Chlamydia  Date Value Ref Range Status  01/03/2023 Negative  Final   GBS: Negative/-- (12/20 1348)    Prenatal Transfer Tool  Maternal Diabetes: Yes:  Diabetes Type:  Diet controlled Genetic Screening: Normal Maternal Ultrasounds/Referrals: Normal Fetal Ultrasounds or other Referrals:  Referred to Materal Fetal Medicine /echogenic bowel resolved on f/up Maternal Substance Abuse:  No Significant Maternal Medications:  Meds include: Other: Zonegran Significant Maternal Lab Results: Group B Strep negative  Results for orders placed or performed during the hospital encounter of 01/07/23 (from the past 24 hours)  CBC   Collection Time: 01/07/23  4:34 PM  Result Value Ref Range   WBC 4.7 4.0 - 10.5 K/uL   RBC 3.57 (L) 3.87 - 5.11 MIL/uL   Hemoglobin 10.8 (L) 12.0 - 15.0 g/dL   HCT 91.4 (L) 78.2 - 95.6 %   MCV 88.0 80.0 - 100.0 fL   MCH 30.3 26.0 - 34.0 pg   MCHC 34.4 30.0 - 36.0 g/dL   RDW 21.3 08.6 - 57.8 %   Platelets 175 150 - 400 K/uL   nRBC 0.0 0.0 - 0.2 %  Type and screen   Collection Time: 01/07/23  4:34 PM  Result Value Ref Range   ABO/RH(D) PENDING    Antibody Screen PENDING    Sample Expiration      01/10/2023,2359 Performed at [facility], [address] [place], [place] [address]     Assessment: Marissa Cross is a 32 y.o. G3P2002 at [redacted]w[redacted]d here for IOL 2/2 A1GDM and cholestasis  #Labor: Pit as patient is at [facility]. Balloon as able #Pain: IV pain meds PRN, epidural upon request #FHT: Category I #GBS/ID: Negative #MOF: breast feeding and bottle feeding #MOC:  BTL  #A1GDM: CGB Q4 latent, Q2 labor #TOLAC: prior NSVD, followed by C/S for abruption #Seizure disorder: Continue  Zonegran  Joanne Gavel, MD [facility] Fellow Center for [facility], [facility]  01/07/2023, 5:07 PM

## 2023-01-07 NOTE — Plan of Care (Signed)
  Problem: Education: Goal: Knowledge of General Education information will improve Description: Including pain rating scale, medication(s)/side effects and non-pharmacologic comfort measures Outcome: Progressing   Problem: Health Behavior/Discharge Planning: Goal: Ability to manage health-related needs will improve Outcome: Progressing   Problem: Clinical Measurements: Goal: Ability to maintain clinical measurements within normal limits will improve Outcome: Progressing Goal: Will remain free from infection Outcome: Progressing Goal: Diagnostic test results will improve Outcome: Progressing Goal: Respiratory complications will improve Outcome: Progressing Goal: Cardiovascular complication will be avoided Outcome: Progressing   Problem: Activity: Goal: Risk for activity intolerance will decrease Outcome: Progressing   Problem: Nutrition: Goal: Adequate nutrition will be maintained Outcome: Progressing   Problem: Coping: Goal: Level of anxiety will decrease Outcome: Progressing   Problem: Elimination: Goal: Will not experience complications related to bowel motility Outcome: Progressing Goal: Will not experience complications related to urinary retention Outcome: Progressing   Problem: Pain Management: Goal: General experience of comfort will improve Outcome: Progressing   Problem: Safety: Goal: Ability to remain free from injury will improve Outcome: Progressing   Problem: Skin Integrity: Goal: Risk for impaired skin integrity will decrease Outcome: Progressing   Problem: Education: Goal: Knowledge of Childbirth will improve Outcome: Progressing Goal: Ability to make informed decisions regarding treatment and plan of care will improve Outcome: Progressing Goal: Ability to state and carry out methods to decrease the pain will improve Outcome: Progressing Goal: Individualized Educational Video(s) Outcome: Progressing   Problem: Coping: Goal: Ability to  verbalize concerns and feelings about labor and delivery will improve Outcome: Progressing   Problem: Life Cycle: Goal: Ability to make normal progression through stages of labor will improve Outcome: Progressing Goal: Ability to effectively push during vaginal delivery will improve Outcome: Progressing   Problem: Role Relationship: Goal: Will demonstrate positive interactions with the child Outcome: Progressing   Problem: Safety: Goal: Risk of complications during labor and delivery will decrease Outcome: Progressing   Problem: Pain Management: Goal: Relief or control of pain from uterine contractions will improve Outcome: Progressing

## 2023-01-07 NOTE — Progress Notes (Signed)
Labor Progress Note Marissa Cross is a 32 y.o. G3P2002 at [redacted]w[redacted]d presented for IOL 2/2 cholestasis and A1GDM S: Comfortable. Discussed roles, risk and benefits of FB; pt agreeable.   O:  BP 116/62   Pulse 98   Temp 98.6 F (37 C) (Oral)   Resp 16   Ht 5\' 3"  (1.6 m)   Wt 94 kg   LMP 04/29/2022 (Approximate)   BMI 36.72 kg/m  EFM: 135/moderate variability/accels present/no decels  CVE: Dilation: 1.5 Effacement (%): Thick Station: Ballotable Presentation: Vertex Exam by:: Dr Leanora Cover   A&P: 32 y.o. [id number] [redacted]w[redacted]d admitted for IOL #Labor: Progressing well. FB placed with 52ml. Continue pitocin titration #Pain: IV Fentanyl 50 for FB placement #FWB: Cat I #GBS negative  #A1GDM: CGB Q4 latent, Q2 labor. Last 69, Asymptomatic. #TOLAC: prior NSVD, followed by C/S for abruption #Seizure disorder: Continue Zonegran  Wyn Forster, MD 10:09 PM

## 2023-01-08 ENCOUNTER — Encounter (HOSPITAL_COMMUNITY): Payer: Self-pay | Admitting: Family Medicine

## 2023-01-08 ENCOUNTER — Inpatient Hospital Stay (HOSPITAL_COMMUNITY): Payer: Medicaid Other | Admitting: Anesthesiology

## 2023-01-08 ENCOUNTER — Inpatient Hospital Stay (HOSPITAL_COMMUNITY): Payer: Medicaid Other

## 2023-01-08 DIAGNOSIS — M7989 Other specified soft tissue disorders: Secondary | ICD-10-CM

## 2023-01-08 DIAGNOSIS — O34211 Maternal care for low transverse scar from previous cesarean delivery: Secondary | ICD-10-CM

## 2023-01-08 DIAGNOSIS — O24420 Gestational diabetes mellitus in childbirth, diet controlled: Secondary | ICD-10-CM

## 2023-01-08 DIAGNOSIS — O99214 Obesity complicating childbirth: Secondary | ICD-10-CM

## 2023-01-08 DIAGNOSIS — O2662 Liver and biliary tract disorders in childbirth: Secondary | ICD-10-CM

## 2023-01-08 DIAGNOSIS — Z3A37 37 weeks gestation of pregnancy: Secondary | ICD-10-CM

## 2023-01-08 LAB — GLUCOSE, CAPILLARY
Glucose-Capillary: 107 mg/dL — ABNORMAL HIGH (ref 70–99)
Glucose-Capillary: 95 mg/dL (ref 70–99)
Glucose-Capillary: 60 mg/dL — ABNORMAL LOW (ref 70–99)
Glucose-Capillary: 106 mg/dL — ABNORMAL HIGH (ref 70–99)
Glucose-Capillary: 56 mg/dL — ABNORMAL LOW (ref 70–99)
Glucose-Capillary: 137 mg/dL — ABNORMAL HIGH (ref 70–99)

## 2023-01-08 LAB — RPR: RPR Ser Ql: NONREACTIVE

## 2023-01-08 MED ORDER — LIDOCAINE-EPINEPHRINE (PF) 2 %-1:200000 IJ SOLN
INTRAMUSCULAR | Status: DC | PRN
  Administered 2023-01-08: 5 mL via EPIDURAL

## 2023-01-08 MED ORDER — FENTANYL-BUPIVACAINE-NACL 0.5-0.125-0.9 MG/250ML-% EP SOLN
12.0000 mL/h | EPIDURAL | Status: DC | PRN
  Administered 2023-01-08: 12 mL/h via EPIDURAL
  Filled 2023-01-08: qty 250

## 2023-01-08 MED ORDER — ZONISAMIDE 100 MG PO CAPS
300.0000 mg | ORAL_CAPSULE | Freq: Every day | ORAL | Status: DC
  Administered 2023-01-09 – 2023-01-10 (×2): 300 mg via ORAL
  Filled 2023-01-08 (×2): qty 3

## 2023-01-08 MED ORDER — OXYCODONE HCL 5 MG PO TABS: 10.0000 mg | ORAL_TABLET | ORAL | Status: DC | PRN

## 2023-01-08 MED ORDER — LACTATED RINGERS IV SOLN: 500.0000 mL | Freq: Once | INTRAVENOUS | Status: DC

## 2023-01-08 MED ORDER — OXYCODONE HCL 5 MG PO TABS: 5.0000 mg | ORAL_TABLET | ORAL | Status: DC | PRN

## 2023-01-08 MED ORDER — PHENYLEPHRINE 80 MCG/ML (10ML) SYRINGE FOR IV PUSH (FOR BLOOD PRESSURE SUPPORT): 80.0000 ug | PREFILLED_SYRINGE | INTRAVENOUS | Status: DC | PRN

## 2023-01-08 MED ORDER — MAGNESIUM GLUCONATE 500 MG PO TABS
500.0000 mg | ORAL_TABLET | Freq: Once | ORAL | Status: AC
  Administered 2023-01-08: 500 mg via ORAL
  Filled 2023-01-08: qty 1

## 2023-01-08 MED ORDER — SENNOSIDES-DOCUSATE SODIUM 8.6-50 MG PO TABS
2.0000 | ORAL_TABLET | ORAL | Status: DC
  Administered 2023-01-09 – 2023-01-10 (×2): 2 via ORAL
  Filled 2023-01-08 (×2): qty 2

## 2023-01-08 MED ORDER — DIBUCAINE (PERIANAL) 1 % EX OINT: 1.0000 | TOPICAL_OINTMENT | CUTANEOUS | Status: DC | PRN

## 2023-01-08 MED ORDER — DIPHENHYDRAMINE HCL 50 MG/ML IJ SOLN: 12.5000 mg | INTRAMUSCULAR | Status: DC | PRN

## 2023-01-08 MED ORDER — EPHEDRINE 5 MG/ML INJ: 10.0000 mg | INTRAVENOUS | Status: DC | PRN

## 2023-01-08 MED ORDER — COCONUT OIL OIL
1.0000 | TOPICAL_OIL | Status: DC | PRN
  Administered 2023-01-08: 1 via TOPICAL

## 2023-01-08 MED ORDER — ONDANSETRON HCL 4 MG/2ML IJ SOLN: 4.0000 mg | INTRAMUSCULAR | Status: DC | PRN

## 2023-01-08 MED ORDER — SIMETHICONE 80 MG PO CHEW
80.0000 mg | CHEWABLE_TABLET | ORAL | Status: DC | PRN
Start: 2023-01-08 — End: 2023-01-10

## 2023-01-08 MED ORDER — DIPHENHYDRAMINE HCL 25 MG PO CAPS: 25.0000 mg | ORAL_CAPSULE | Freq: Four times a day (QID) | ORAL | Status: DC | PRN

## 2023-01-08 MED ORDER — IBUPROFEN 600 MG PO TABS
600.0000 mg | ORAL_TABLET | Freq: Four times a day (QID) | ORAL | Status: DC
  Administered 2023-01-08 – 2023-01-10 (×8): 600 mg via ORAL
  Filled 2023-01-08 (×8): qty 1

## 2023-01-08 MED ORDER — LIDOCAINE-PRILOCAINE 2.5-2.5 % EX CREA
TOPICAL_CREAM | CUTANEOUS | Status: DC | PRN
  Filled 2023-01-08: qty 30

## 2023-01-08 MED ORDER — CAMPHOR-MENTHOL 0.5-0.5 % EX LOTN
TOPICAL_LOTION | CUTANEOUS | Status: DC | PRN
  Filled 2023-01-08: qty 222

## 2023-01-08 MED ORDER — MAGNESIUM HYDROXIDE 400 MG/5ML PO SUSP: 30.0000 mL | ORAL | Status: DC | PRN

## 2023-01-08 MED ORDER — ACETAMINOPHEN 325 MG PO TABS: 650.0000 mg | ORAL_TABLET | ORAL | Status: DC | PRN

## 2023-01-08 MED ORDER — ZONISAMIDE 100 MG PO CAPS
300.0000 mg | ORAL_CAPSULE | Freq: Every day | ORAL | Status: DC
  Filled 2023-01-08: qty 3

## 2023-01-08 MED ORDER — BENZOCAINE-MENTHOL 20-0.5 % EX AERO
1.0000 | INHALATION_SPRAY | CUTANEOUS | Status: DC | PRN
  Filled 2023-01-08 (×2): qty 56

## 2023-01-08 MED ORDER — PRENATAL MULTIVITAMIN CH
1.0000 | ORAL_TABLET | Freq: Every day | ORAL | Status: DC
  Administered 2023-01-09 – 2023-01-10 (×2): 1 via ORAL
  Filled 2023-01-08 (×2): qty 1

## 2023-01-08 MED ORDER — WITCH HAZEL-GLYCERIN EX PADS: 1.0000 | MEDICATED_PAD | CUTANEOUS | Status: DC | PRN

## 2023-01-08 MED ORDER — HYDROXYZINE HCL 25 MG PO TABS
50.0000 mg | ORAL_TABLET | Freq: Three times a day (TID) | ORAL | Status: DC | PRN
  Administered 2023-01-08: 50 mg via ORAL
  Filled 2023-01-08: qty 1

## 2023-01-08 MED ORDER — ONDANSETRON HCL 4 MG PO TABS: 4.0000 mg | ORAL_TABLET | ORAL | Status: DC | PRN

## 2023-01-08 NOTE — Progress Notes (Addendum)
Labor Progress Note QUITA BOTTICELLI is a 32 y.o. G3P2002 at [redacted]w[redacted]d presented for IOL for cholestasis and A1GDM S: Comfortable, mild-moderate contractions. FB out. Discussed roles, risks and benefits of AROM; pt agreeable.   O:  BP 120/71   Pulse 88   Temp 98.5 F (36.9 C) (Oral)   Resp 18   Ht 5\' 3"  (1.6 m)   Wt 94 kg   LMP 04/29/2022 (Approximate)   BMI 36.72 kg/m  EFM: 130/moderate variability/accels present/no decels  CVE: Dilation: 4 Effacement (%): 50 Station: -3 Presentation: Vertex Exam by:: Dr Leanora Cover   A&P: 32 y.o. [id number] [redacted]w[redacted]d admitted for IOL #Labor: Progressing well. FB out. AROM clear. Pitocin at 12, continue to titrate.  #Pain: Mild #FWB: Cat I #GBS negative  #A1GDM: CGB Q4 latent, Q2 labor. Last 69, Asymptomatic. #TOLAC: prior NSVD, followed by C/S for abruption #Seizure disorder: Continue Zonegran  Wyn Forster, MD 1:30 AM

## 2023-01-08 NOTE — Lactation Note (Addendum)
This note was copied from a baby's chart. Lactation Consultation Note  Patient Name: Marissa Cross Date: 01/08/2023 Age:33 hours Reason for consult: Initial assessment;Early term 37-38.6wks;Infant < 6lbs. See MOB-MR: hx of A1GDM.  MOB latched infant on her right breast using the football hold, infant BF for 15 minutes and afterwards was supplemented with 22 kca formula 15 mls that FOB supplement infant while MOB was using the DEBP. When MOB was pumping she was expressing small drops of colostrum in the breast flange. MOB understands that her EBM is safe for 4 hours at room temperature whereas formula once open must be used within 1 hour. MOB will follow the LPTI feeding guidelines. MOB will call for latch assistance if need. LC discussed importance of maternal rest, balance meals and snacks. MOB was  made aware of O/P services, breastfeeding support groups, community resources, and our phone # for post-discharge questions.    Current feeding guidelines: 1- MOB will follow LPTI feeding guidelines, BF infant every 3 hours, skin to skin and limit total feedings breast and bottle to 30 minutes or less. 2- MOB has "MY Care Plan" and Day 1 will supplement infant after latching infant at the breast with (12-14 mls ) or more per feeding or EBM/formula.  3- MOB will call for further latch assistance if needed. 4- MOB will continue to use DEBP every 3 hours for 15 minutes on initial setting and will give infant back any EBM first before formula.  Maternal Data Has patient been taught Hand Expression?: Yes Does the patient have breastfeeding experience prior to this delivery?: Yes How long did the patient breastfeed?: Per MOB, she BF 1st child for 18 months and 2nd child who is 6 years for one year.  Feeding Mother's Current Feeding Choice: Breast Milk and Formula Nipple Type: Nfant Standard Flow (white)  LATCH Score Latch: Grasps breast easily, tongue down, lips flanged, rhythmical  sucking.  Audible Swallowing: A few with stimulation  Type of Nipple: Everted at rest and after stimulation  Comfort (Breast/Nipple): Soft / non-tender  Hold (Positioning): Assistance needed to correctly position infant at breast and maintain latch.  LATCH Score: 8   Lactation Tools Discussed/Used Tools: Pump;Flanges;Bottle Flange Size: 21 Breast pump type: Double-Electric Breast Pump Pump Education: Setup, frequency, and cleaning;Milk Storage Reason for Pumping: Infant is ETI and less than 6 lbs Pumping frequency: MOB will continue to pump every 3 hours for 15 minutes  Interventions Interventions: Breast feeding basics reviewed;Assisted with latch;Skin to skin;Breast compression;Adjust position;Support pillows;Position options;DEBP;Education;Guidelines for Milk Supply and Pumping Schedule Handout;LC Services brochure;CDC Guidelines for Breast Pump Cleaning;LPT handout/interventions  Discharge Pump: DEBP;Personal (Per MOB, she has ordered DEBP with her insurance.)  Consult Status Consult Status: Follow-up Date: 01/09/23 Follow-up type: In-patient    Frederico Hamman 01/08/2023, 8:36 PM

## 2023-01-08 NOTE — Discharge Summary (Signed)
Postpartum Discharge Summary     Patient Name: Marissa Cross DOB: 1990/10/30 MRN: [id number]  Date of admission: 01/07/2023 Delivery date:01/08/2023 Delivering provider: Venora Maples Date of discharge: 01/10/2023  Admitting diagnosis: Cholestasis during pregnancy in third trimester [O26.643] Intrauterine pregnancy: [redacted]w[redacted]d     Secondary diagnosis:  Principal Problem:   VBAC (vaginal birth after Cesarean) Active Problems:   Supervision of high risk pregnancy, antepartum   Seizure disorder during pregnancy in third trimester [facility])   Previous cesarean delivery, antepartum   Headache in pregnancy, antepartum, third trimester   Obesity affecting pregnancy, antepartum   Elevated SMA risk   Generalized anxiety disorder   GDM (gestational diabetes mellitus)   Cholestasis of pregnancy   Cholestasis during pregnancy in third trimester  Additional problems: None    Discharge diagnosis: Term Pregnancy Delivered, VBAC, and GDM A1                                              Post partum procedures: None Augmentation: AROM, Pitocin, and IP Foley Complications: None  Hospital course: Induction of Labor With Vaginal Delivery   32 y.o. yo G3P3003 at [redacted]w[redacted]d was admitted to the hospital 01/07/2023 for induction of labor.  Indication for induction: A1 DM and Cholestasis of pregnancy.  Patient had an labor course complicated by: Patient arrived at 0 cm dilation and was induced with foley, pitocin and AROM. She had a prolonged latent labor at 4-5 cm but at ~20h of IOL progressed to complete and had a short second stage  Membrane Rupture Time/Date: 1:17 AM,01/08/2023  Delivery Method:Vaginal, Spontaneous Operative Delivery:N/A Episiotomy: None Lacerations:  None Details of delivery can be found in separate delivery note.  Patient had a postpartum course complicated by none. Initially planning a PP BTL, however was unsure after delivery. Patient is discharged home 01/10/23.  Newborn Data: Birth  date:01/08/2023 Birth time:12:36 PM Gender:Female Living status:Living Apgars:8 ,9  Weight:2720 g  Magnesium Sulfate received: No BMZ received: No Rhophylac:N/A MMR:N/A T-DaP: No Flu: No RSV Vaccine received: No Transfusion:No  Immunizations received: Immunization History  Administered Date(s) Administered   Influenza,inj,Quad PF,6+ Mos 01/18/2014, 12/19/2014, 11/30/2020   PFIZER Comirnaty(Gray Top)Covid-19 Tri-Sucrose Vaccine 07/10/2020, 07/31/2020   Pfizer(Comirnaty)Fall Seasonal Vaccine 12 years and older 07/10/2020, 07/31/2020   Rsv, Bivalent, Protein Subunit Rsvpref,pf Verdis Frederickson) 12/17/2022    Physical exam  Vitals:   01/09/23 0451 01/09/23 1325 01/09/23 2144 01/10/23 0508  BP: 107/82 124/65 136/76 122/66  Pulse: 72 68 60 78  Resp: 16 18 16 14   Temp: 98 F (36.7 C) 98.5 F (36.9 C) 98.7 F (37.1 C) 98.4 F (36.9 C)  TempSrc: Oral Oral Oral Oral  SpO2: 100% 100% 100% 100%  Weight:      Height:       General: alert, cooperative, and no distress Lochia: appropriate Uterine Fundus: firm Incision: N/A DVT Evaluation: No evidence of DVT seen on physical exam. Labs: Lab Results  Component Value Date   WBC 7.4 01/09/2023   HGB 11.5 (L) 01/09/2023   HCT 33.2 (L) 01/09/2023   MCV 87.8 01/09/2023   PLT 154 01/09/2023      Latest Ref Rng & Units 01/07/2023    4:34 PM  CMP  Glucose 70 - 99 mg/dL 93   BUN 6 - 20 mg/dL <5   Creatinine 3.08 - 1.00 mg/dL 6.57   Sodium 846 - 962  mmol/L 139   Potassium 3.5 - 5.1 mmol/L 2.9   Chloride 98 - 111 mmol/L 108   CO2 22 - 32 mmol/L 20   Calcium 8.9 - 10.3 mg/dL 8.6   Total Protein 6.5 - 8.1 g/dL 5.0   Total Bilirubin <1.6 mg/dL 0.6   Alkaline Phos 38 - 126 U/L 82   AST 15 - 41 U/L 23   ALT 0 - 44 U/L 19    Edinburgh Score:    01/09/2023   10:15 AM  Edinburgh Postnatal Depression Scale Screening Tool  I have been able to laugh and see the funny side of things. 0  I have looked forward with enjoyment to things. 0   I have blamed myself unnecessarily when things went wrong. 1  I have been anxious or worried for no good reason. 2  I have felt scared or panicky for no good reason. 2  Things have been getting on top of me. 1  I have been so unhappy that I have had difficulty sleeping. 0  I have felt sad or miserable. 0  I have been so unhappy that I have been crying. 1  The thought of harming myself has occurred to me. 0  Edinburgh Postnatal Depression Scale Total 7   Edinburgh Postnatal Depression Scale Total: 7   After visit meds:  Allergies as of 01/10/2023   No Known Allergies      Medication List     STOP taking these medications    Accu-Chek Guide test strip Generic drug: glucose blood   Accu-Chek Softclix Lancets lancets   cyclobenzaprine 10 MG tablet Commonly known as: FLEXERIL   hydrOXYzine 25 MG capsule Commonly known as: Vistaril   Magnesium Oxide 400 MG Caps   ondansetron 4 MG disintegrating tablet Commonly known as: Zofran ODT   Riboflavin 400 MG Caps       TAKE these medications    acetaminophen 325 MG tablet Commonly known as: Tylenol Take 2 tablets (650 mg total) by mouth every 4 (four) hours as needed (for pain scale < 4).   ibuprofen 600 MG tablet Commonly known as: ADVIL Take 1 tablet (600 mg total) by mouth every 6 (six) hours.   prenatal vitamin w/FE, FA 27-1 MG Tabs tablet Take 1 tablet by mouth daily at 12 noon.   pyridOXINE 100 MG tablet Commonly known as: VITAMIN B6 Take 100 mg by mouth daily.   senna-docusate 8.6-50 MG tablet Commonly known as: Senokot-S Take 2 tablets by mouth daily.   zonisamide 100 MG capsule Commonly known as: ZONEGRAN Take 3 capsules every night         Discharge home in stable condition Infant Feeding: Bottle and Breast Infant Disposition:home with mother Discharge instruction: per After Visit Summary and Postpartum booklet. Activity: Advance as tolerated. Pelvic rest for 6 weeks.  Diet: routine  diet Future Appointments: Future Appointments  Date Time Provider Department Center  02/14/2023 11:30 AM Van Clines, MD [facility] None   Follow up Visit:   Please schedule this patient for a In person postpartum visit in 4 weeks with the following provider: MD. Additional Postpartum F/U:Postpartum Depression checkup and 2 hour GTT  High risk pregnancy complicated by: GDM and seizure disorder Delivery mode:  Vaginal, Spontaneous Anticipated Birth Control:  Unsure   01/10/2023 Joanne Gavel, MD OB Fellow

## 2023-01-08 NOTE — Progress Notes (Signed)
Called for continued pain, pt requesting [place] to r/o DVT  Blood pressure 98/62, pulse 87, temperature 98 F (36.7 C), temperature source Axillary, resp. rate 20, height 5\' 3"  (1.6 m), weight 94 kg, last menstrual period 04/29/2022.   Imaging ordered.  Though low suspicion based on exam.   Wyn Forster, MD FMOB Fellow, Faculty practice [facility], Center for [facility]

## 2023-01-08 NOTE — Progress Notes (Addendum)
Labor Progress Note ADRINE RIDALL is a 32 y.o. G3P2002 at [redacted]w[redacted]d presented for IOL for cholestasis and A1GDM S: Epidural placed, starting to have good effect   O:  BP (!) 104/54   Pulse 84   Temp 98.2 F (36.8 C) (Oral)   Resp 16   Ht 5\' 3"  (1.6 m)   Wt 94 kg   LMP 04/29/2022 (Approximate)   BMI 36.72 kg/m  EFM: 120/moderate variability/accels present/intermittent variable decels  CVE: Dilation: 5 Effacement (%): 80 Station: -3 Presentation: Vertex Exam by:: k fields, rn   A&P: 32 y.o. [id number] [redacted]w[redacted]d admitted for IOL for cholestasis #Labor: S/p AROM at 0117. Has been on pitocin since around 1700 yesterday, ~18h at this point and is on 20 of pitocin. Has made minimal change over that time though on this exam station did seem closer to -2. IUPC placed without issue, plan to recheck in 2 hours, if no change then will pursue pit break.  #Pain: epidural #FWB: Cat II but overall very reassuring with moderate variability and accels #GBS negative  #A1GDM: CGB Q4 latent, Q2 labor. Normal to date. #TOLAC: prior NSVD, followed by C/S for abruption #Seizure disorder: Continue Zonegran  #R Arm pain: s/u RUE [place], results pending, on my prelim review of images full compressibility and good doppler flow throughout the arm, will await final read  Venora Maples, MD 11:11 AM

## 2023-01-08 NOTE — Anesthesia Preprocedure Evaluation (Addendum)
Anesthesia Evaluation  Patient identified by MRN, date of birth, ID band Patient awake    Reviewed: Allergy & Precautions, Patient's Chart, lab work & pertinent test results  Airway Mallampati: II  TM Distance: >3 FB Neck ROM: Full    Dental  (+) Teeth Intact   Pulmonary    breath sounds clear to auscultation       Cardiovascular negative cardio ROS  Rhythm:Regular Rate:Normal     Neuro/Psych  Headaches, Seizures -,  PSYCHIATRIC DISORDERS Anxiety        GI/Hepatic Neg liver ROS,,,  Endo/Other  diabetes    Renal/GU negative Renal ROS     Musculoskeletal negative musculoskeletal ROS (+)    Abdominal   Peds  Hematology negative hematology ROS (+)   Anesthesia Other Findings   Reproductive/Obstetrics (+) Pregnancy                             Anesthesia Physical Anesthesia Plan  ASA: 3  Anesthesia Plan: Epidural   Post-op Pain Management: Minimal or no pain anticipated   Induction:   PONV Risk Score and Plan: 0  Airway Management Planned: Natural Airway and Nasal Cannula  Additional Equipment: None  Intra-op Plan:   Post-operative Plan:   Informed Consent: I have reviewed the patients History and Physical, chart, labs and discussed the procedure including the risks, benefits and alternatives for the proposed anesthesia with the patient or authorized representative who has indicated his/her understanding and acceptance.       Plan Discussed with:   Anesthesia Plan Comments: (Lab Results      Component                Value               Date                      WBC                      4.7                 01/07/2023                HGB                      10.8 (L)            01/07/2023                HCT                      31.4 (L)            01/07/2023                MCV                      88.0                01/07/2023                PLT                      175                  01/07/2023           )  Anesthesia Quick Evaluation

## 2023-01-08 NOTE — Progress Notes (Signed)
Called to assess patients right arm pain  Present on admission, started 2 months ago Dx with bilateral carpal tunnel syndrome R>L Braces initially with some relief but then felt splinting made it feel "more swollen" Not tried any treatment at home Typically worse at night Heat and elevation typically improve some, but are not much help tonight  Menthol cream minimally helpful  Pain radiates to behind right scapula, starts in finger tips, especially middle finger Symmetric grip strength Full active and passive ROM No warmth, erythema, edema of hand, wrist nor arm to shoulder Cap refill <2 seconds on all fingers Pulses symmetric at wrists and AC  DTR +2 at wrist and elbow Tinels positive at wrist and elbow No cords  Blood pressure 112/74, pulse 90, temperature 98 F (36.7 C), temperature source Oral, resp. rate 18, height 5\' 3"  (1.6 m), weight 94 kg, last menstrual period 04/29/2022.  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

## 2023-01-08 NOTE — Progress Notes (Signed)
Patient's CBG is 60. Hypoglycemic protocol started and patient given 4 oz. Apple juice to drink. Will reassess CBG in 15 minutes per protocol.

## 2023-01-08 NOTE — Anesthesia Procedure Notes (Signed)
Epidural Patient location during procedure: OB Start time: 01/08/2023 8:50 AM End time: 01/08/2023 8:56 AM  Staffing Anesthesiologist: Shelton Silvas, MD Performed: anesthesiologist   Preanesthetic Checklist Completed: patient identified, IV checked, site marked, risks and benefits discussed, surgical consent, monitors and equipment checked, pre-op evaluation and timeout performed  Epidural Patient position: sitting Prep: DuraPrep Patient monitoring: heart rate, continuous pulse ox and blood pressure Approach: midline Location: L3-L4 Injection technique: LOR saline  Needle:  Needle type: Tuohy  Needle gauge: 17 G Needle length: 9 cm Catheter type: closed end flexible Catheter size: 20 Guage Test dose: negative and 1.5% lidocaine  Assessment Events: blood not aspirated, no cerebrospinal fluid, injection not painful, no injection resistance and no paresthesia  Additional Notes LOR @ 5  Patient identified. Risks/Benefits/Options discussed with patient including but not limited to bleeding, infection, nerve damage, paralysis, failed block, incomplete pain control, headache, blood pressure changes, nausea, vomiting, reactions to medications, itching and postpartum back pain. Confirmed with bedside nurse the patient's most recent platelet count. Confirmed with patient that they are not currently taking any anticoagulation, have any bleeding history or any family history of bleeding disorders. Patient expressed understanding and wished to proceed. All questions were answered. Sterile technique was used throughout the entire procedure. Please see nursing notes for vital signs. Test dose was given through epidural catheter and negative prior to continuing to dose epidural or start infusion. Warning signs of high block given to the patient including shortness of breath, tingling/numbness in hands, complete motor block, or any concerning symptoms with instructions to call for help. Patient  was given instructions on fall risk and not to get out of bed. All questions and concerns addressed with instructions to call with any issues or inadequate analgesia.    Reason for block:procedure for pain

## 2023-01-08 NOTE — Progress Notes (Signed)
Upper extremity venous duplex completed. Please see CV Procedures for preliminary results.  Shona Simpson, RVT 01/08/23 9:31 AM

## 2023-01-08 NOTE — Lactation Note (Addendum)
This note was copied from a baby's chart. Lactation Consultation Note  Patient Name: Marissa Cross Today's Date: 01/08/2023 Age:33 hours MOB was eating dinner will call LC services later today when ready for latch assistance.    Maternal Data    Feeding    LATCH Score   Lactation Tools Discussed/Used    Interventions    Discharge    Consult Status      Frederico Hamman 01/08/2023, 5:51 PM

## 2023-01-09 LAB — CBC
HCT: 33.2 % — ABNORMAL LOW (ref 36.0–46.0)
Hemoglobin: 11.5 g/dL — ABNORMAL LOW (ref 12.0–15.0)
MCH: 30.4 pg (ref 26.0–34.0)
MCHC: 34.6 g/dL (ref 30.0–36.0)
MCV: 87.8 fL (ref 80.0–100.0)
Platelets: 154 10*3/uL (ref 150–400)
RBC: 3.78 MIL/uL — ABNORMAL LOW (ref 3.87–5.11)
RDW: 15 % (ref 11.5–15.5)
WBC: 7.4 10*3/uL (ref 4.0–10.5)
nRBC: 0 % (ref 0.0–0.2)

## 2023-01-09 NOTE — Anesthesia Postprocedure Evaluation (Signed)
Anesthesia Post Note  Patient: Marissa Cross  Procedure(s) Performed: AN AD HOC LABOR EPIDURAL     Patient location during evaluation: Mother Baby Anesthesia Type: Epidural Level of consciousness: awake and alert Pain management: pain level controlled Vital Signs Assessment: post-procedure vital signs reviewed and stable Respiratory status: spontaneous breathing, nonlabored ventilation and respiratory function stable Cardiovascular status: stable Postop Assessment: no headache, no backache and epidural receding Anesthetic complications: no   No notable events documented.  Last Vitals:  Vitals:   01/08/23 2332 01/09/23 0451  BP: (!) 113/50 107/82  Pulse: 63 72  Resp: 18 16  Temp: 36.7 C 36.7 C  SpO2: 100% 100%    Last Pain:  Vitals:   01/09/23 0451  TempSrc: Oral  PainSc: 6    Pain Goal:                   Jayson Waterhouse

## 2023-01-09 NOTE — Progress Notes (Signed)
POSTPARTUM PROGRESS NOTE  PPD #1  Subjective:  Marissa Cross is a 32 y.o. [id number] s/p VBAC at [redacted]w[redacted]d. Today she notes she is doing well. She denies any problems with ambulating, voiding or po intake. Denies nausea or vomiting. She has passed flatus, no BM.  Pain is well controlled.  Lochia minimal Denies fever/chills/chest pain/SOB.  no HA, no blurry vision, no RUQ pain  Objective: Blood pressure 107/82, pulse 72, temperature 98 F (36.7 C), temperature source Oral, resp. rate 16, height 5\' 3"  (1.6 m), weight 94 kg, last menstrual period 04/29/2022, SpO2 100%, unknown if currently breastfeeding.  Physical Exam:  General: alert, cooperative and no distress Chest: no respiratory distress Heart: regular rate and rhythm Abdomen: soft, nontender Uterine Fundus: firm, appropriately tender DVT Evaluation: No calf swelling or tenderness Extremities: 2+ edema Skin: warm, dry  Results for orders placed or performed during the hospital encounter of 01/07/23 (from the past 24 hours)  Glucose, capillary     Status: Abnormal   Collection Time: 01/08/23 11:19 AM  Result Value Ref Range   Glucose-Capillary 60 (L) 70 - 99 mg/dL  Glucose, capillary     Status: Abnormal   Collection Time: 01/08/23 11:37 AM  Result Value Ref Range   Glucose-Capillary 56 (L) 70 - 99 mg/dL  Glucose, capillary     Status: Abnormal   Collection Time: 01/08/23 12:04 PM  Result Value Ref Range   Glucose-Capillary 106 (H) 70 - 99 mg/dL  Glucose, capillary     Status: Abnormal   Collection Time: 01/08/23  2:31 PM  Result Value Ref Range   Glucose-Capillary 137 (H) 70 - 99 mg/dL  CBC     Status: Abnormal   Collection Time: 01/09/23  5:10 AM  Result Value Ref Range   WBC 7.4 4.0 - 10.5 K/uL   RBC 3.78 (L) 3.87 - 5.11 MIL/uL   Hemoglobin 11.5 (L) 12.0 - 15.0 g/dL   HCT 28.3 (L) 15.1 - 76.1 %   MCV 87.8 80.0 - 100.0 fL   MCH 30.4 26.0 - 34.0 pg   MCHC 34.6 30.0 - 36.0 g/dL   RDW 60.7 37.1 - 06.2 %   Platelets 154 150  - 400 K/uL   nRBC 0.0 0.0 - 0.2 %    Assessment/Plan: Marissa Cross is a 32 y.o. [id number] s/p VBAC at [redacted]w[redacted]d PPD#1 -pain well controlled -reviewed her concerns about edema, BP normal, otherwise asymptomatic.  Encourage ambulation and elevated feet when possible -continue routine postpartum care  Contraception: still not sure, considering outpt IUD Feeding: breast/bottle  Dispo: Possible discharge home pending baby, for now would like to stay another day   LOS: 2 days   Myna Hidalgo, DO Faculty Attending, Center for [facility] 01/09/2023, 7:07 AM

## 2023-01-09 NOTE — Progress Notes (Signed)
CSW received consult for hx of Anxiety and met with MOB at bedside to offer support and complete assessment.  CSW entered the room, introduced herself and acknowledged that her family were present. MOB gave CSW verbal permission to speak about anything while they were present. CSW explained her role and the reason for the visit. MOB presented bonding/holding the infant and was polite, easy to engage, receptive to meeting with CSW, and appeared forthcoming  CSW inquired about MOB's mental health history. MOB reported being diagnosed with anxiety and PTSD 6 years ago due to a traumatic C-section experience. MOB reported being prescribed medication and participating in therapy in the past and recently with [facility]. MOB reported being prescribed a medication (could not recall the medication) during her pregnancy that she found helpful, and plans to continue the medication during this postpartum period. 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 MOB reported currently feeling "content" and bonded well with the infant. CSW provided education regarding the baby blues period vs. perinatal mood disorders, discussed treatment and gave resources for mental health follow up if concerns arise.  CSW recommends self-evaluation during the postpartum time period using the New Mom Checklist from Postpartum Progress and encouraged MOB to contact a medical professional if symptoms are noted at any time.    CSW asked MOB has she selected a pediatrician for the infant's follow up visits; MOB said [facility]. MOB reported having all essential items for the infant including a carseat, bassinet and crib for safe sleeping. CSW provided review of Sudden Infant Death Syndrome (SIDS) precautions.     CSW identifies no further need for intervention and no barriers to discharge at this  time.  Enos Fling, Theresia Majors Clinical Social Worker [phone]

## 2023-01-09 NOTE — Lactation Note (Signed)
This note was copied from a baby's chart. Lactation Consultation Note  Patient Name: Marissa Cross Date: 01/09/2023 Age:33 hours Reason for consult: Follow-up assessment;Early term 37-38.6wks;Infant < 6lbs;Maternal endocrine disorder;Breastfeeding assistanceas LC entered room, mom attempting to latch the baby in a cradle position.  LC recommended switching position to a football and assisted to latch in the football position. Baby latched after several attempts with depth for 10 mins with increased swallows with breast compressions.  After baby latched and fed . LC reviewed pace feeding with mom and dad. Baby took 10 ml.  Mom  aware to post pump after baby is settled.   Maternal Data Has patient been taught Hand Expression?: Yes (small drop) Does the patient have breastfeeding experience prior to this delivery?: Yes  Feeding Mother's Current Feeding Choice: Breast Milk and Formula Nipple Type: Nfant Standard Flow (white) (switched the baby to a white standard to slow her down)  LATCH Score Latch: Repeated attempts needed to sustain latch, nipple held in mouth throughout feeding, stimulation needed to elicit sucking reflex.  Audible Swallowing: Spontaneous and intermittent  Type of Nipple: Everted at rest and after stimulation (some areola edema, LC instructed mom - reverse pressure)  Comfort (Breast/Nipple): Soft / non-tender  Hold (Positioning): Assistance needed to correctly position infant at breast and maintain latch.  LATCH Score: 8   Lactation Tools Discussed/Used Tools: Pump;Flanges Flange Size: 21 Breast pump type: Double-Electric Breast Pump Pump Education: Milk Storage;Setup, frequency, and cleaning;Other (comment) (pump already set up. mom aware to pump after feeding to enhance milk coming in) Reason for Pumping: Infant ET and less than 6 pounds  Interventions Interventions: Breast feeding basics reviewed;Assisted with latch;Skin to skin;Breast  massage;Hand express;Reverse pressure;Breast compression;Adjust position;Support pillows;Position options;Hand pump;DEBP;Education;Pace feeding;LC Services brochure;LPT handout/interventions;CDC Guidelines for Breast Pump Cleaning  Discharge Pump: DEBP;Personal (per mom has ordered her DEBP, its in process with [employer]. LC mentioned since its in process, may need to rent a DEBP from the Gift shop)  Consult Status Consult Status: Follow-up Date: 01/10/23 Follow-up type: In-patient    Marissa Cross 01/09/2023, 12:19 PM

## 2023-01-10 MED ORDER — SENNOSIDES-DOCUSATE SODIUM 8.6-50 MG PO TABS: 2.0000 | ORAL_TABLET | ORAL | 1 refills | Status: AC

## 2023-01-10 MED ORDER — ACETAMINOPHEN 325 MG PO TABS: 650.0000 mg | ORAL_TABLET | ORAL | 1 refills | Status: AC | PRN

## 2023-01-10 MED ORDER — IBUPROFEN 600 MG PO TABS: 600.0000 mg | ORAL_TABLET | Freq: Four times a day (QID) | ORAL | 1 refills | Status: AC

## 2023-01-10 NOTE — Lactation Note (Signed)
This note was copied from a baby's chart. Lactation Consultation Note  Patient Name: Marissa Cross Date: 01/10/2023 Age:33 hours  Reason for consult: Follow-up assessment;Early term 60-38.6wks   Mother's milk is in. Breast are full. Mother has been feeding formula due to infant's gestation age and her milk was not in. Mother pumped 40 ml and felt relief from her breast fullness. Mother palns to resume breastfeeding now that her milk is in and will supplement with her milk as needed.  Mom made aware of O/P services, breastfeeding support groups, and our phone # for post-discharge questions.     Feeding Mother's Current Feeding Choice: Breast Milk and Formula   Interventions Interventions: Education;Hand pump  Discharge Discharge Education: Engorgement and breast care;Warning signs for feeding baby Pump: Personal;Manual  Consult Status Consult Status: Complete Date: 01/10/23    Omar Person 01/10/2023, 12:11 PM

## 2023-01-13 ENCOUNTER — Encounter (HOSPITAL_COMMUNITY): Payer: Self-pay | Admitting: Obstetrics and Gynecology

## 2023-01-13 ENCOUNTER — Other Ambulatory Visit: Payer: Self-pay

## 2023-01-13 ENCOUNTER — Inpatient Hospital Stay (HOSPITAL_COMMUNITY)
Admission: AD | Admit: 2023-01-13 | Discharge: 2023-01-13 | Disposition: A | Discharge status: Home / Self Care | Payer: Medicaid Other | Attending: Obstetrics and Gynecology | Admitting: Obstetrics and Gynecology

## 2023-01-13 DIAGNOSIS — O9089 Other complications of the puerperium, not elsewhere classified: Secondary | ICD-10-CM | POA: Insufficient documentation

## 2023-01-13 DIAGNOSIS — M545 Low back pain, unspecified: Secondary | ICD-10-CM | POA: Diagnosis not present

## 2023-01-13 DIAGNOSIS — O165 Unspecified maternal hypertension, complicating the puerperium: Secondary | ICD-10-CM

## 2023-01-13 DIAGNOSIS — H6593 Unspecified nonsuppurative otitis media, bilateral: Secondary | ICD-10-CM | POA: Diagnosis not present

## 2023-01-13 DIAGNOSIS — H6503 Acute serous otitis media, bilateral: Secondary | ICD-10-CM

## 2023-01-13 LAB — CBC
HCT: 35.1 % — ABNORMAL LOW (ref 36.0–46.0)
Hemoglobin: 12.1 g/dL (ref 12.0–15.0)
MCH: 30.3 pg (ref 26.0–34.0)
MCHC: 34.5 g/dL (ref 30.0–36.0)
MCV: 88 fL (ref 80.0–100.0)
Platelets: 194 10*3/uL (ref 150–400)
RBC: 3.99 MIL/uL (ref 3.87–5.11)
RDW: 14.3 % (ref 11.5–15.5)
WBC: 4 10*3/uL (ref 4.0–10.5)
nRBC: 0 % (ref 0.0–0.2)

## 2023-01-13 LAB — URINALYSIS, ROUTINE W REFLEX MICROSCOPIC
Bacteria, UA: NONE SEEN
Bilirubin Urine: NEGATIVE
Glucose, UA: NEGATIVE mg/dL
Ketones, ur: NEGATIVE mg/dL
Nitrite: NEGATIVE
Protein, ur: 100 mg/dL — AB
RBC / HPF: >50 RBC/hpf (ref 0–5)
Specific Gravity, Urine: 1.01 (ref 1.005–1.030)
pH: 7 (ref 5.0–8.0)

## 2023-01-13 LAB — COMPREHENSIVE METABOLIC PANEL
ALT: 59 U/L — ABNORMAL HIGH (ref 0–44)
AST: 37 U/L (ref 15–41)
Albumin: 2.9 g/dL — ABNORMAL LOW (ref 3.5–5.0)
Alkaline Phosphatase: 80 U/L (ref 38–126)
Anion gap: 10 (ref 5–15)
BUN: 8 mg/dL (ref 6–20)
CO2: 23 mmol/L (ref 22–32)
Calcium: 8.5 mg/dL — ABNORMAL LOW (ref 8.9–10.3)
Chloride: 109 mmol/L (ref 98–111)
Creatinine, Ser: 0.79 mg/dL (ref 0.44–1.00)
GFR, Estimated: >60 mL/min (ref >60)
Glucose, Bld: 103 mg/dL — ABNORMAL HIGH (ref 70–99)
Potassium: 3 mmol/L — ABNORMAL LOW (ref 3.5–5.1)
Sodium: 142 mmol/L (ref 135–145)
Total Bilirubin: 0.4 mg/dL (ref 0.0–1.2)
Total Protein: 5.5 g/dL — ABNORMAL LOW (ref 6.5–8.1)

## 2023-01-13 MED ORDER — NIFEDIPINE ER OSMOTIC RELEASE 30 MG PO TB24
30.0000 mg | ORAL_TABLET | Freq: Every day | ORAL | Status: DC
  Administered 2023-01-13: 30 mg via ORAL
  Filled 2023-01-13: qty 1

## 2023-01-13 MED ORDER — FUROSEMIDE 20 MG PO TABS: 20.0000 mg | ORAL_TABLET | Freq: Every day | ORAL | 0 refills | Status: DC

## 2023-01-13 MED ORDER — NIFEDIPINE ER 30 MG PO TB24: 30.0000 mg | ORAL_TABLET | Freq: Every day | ORAL | 1 refills | Status: AC

## 2023-01-13 MED ORDER — CYCLOBENZAPRINE HCL 5 MG PO TABS
10.0000 mg | ORAL_TABLET | Freq: Three times a day (TID) | ORAL | Status: DC | PRN
  Administered 2023-01-13: 10 mg via ORAL
  Filled 2023-01-13: qty 2

## 2023-01-13 MED ORDER — ACETAMINOPHEN 500 MG PO TABS
1000.0000 mg | ORAL_TABLET | Freq: Once | ORAL | Status: AC
  Administered 2023-01-13: 1000 mg via ORAL
  Filled 2023-01-13: qty 2

## 2023-01-13 MED ORDER — FUROSEMIDE 20 MG PO TABS
20.0000 mg | ORAL_TABLET | Freq: Every day | ORAL | Status: DC
  Administered 2023-01-13: 20 mg via ORAL
  Filled 2023-01-13: qty 1

## 2023-01-13 MED ORDER — IBUPROFEN 600 MG PO TABS
600.0000 mg | ORAL_TABLET | Freq: Once | ORAL | Status: AC
Start: 2023-01-13 — End: 2023-01-13
  Administered 2023-01-13: 600 mg via ORAL
  Filled 2023-01-13: qty 1

## 2023-01-13 NOTE — MAU Note (Signed)
Patient would like to wait to speak with the MD regarding her pains prior to taking pain medications.   Patient set up on breast pump. Call bell within reach.

## 2023-01-13 NOTE — MAU Provider Note (Signed)
Chief Complaint:  Swelling  and Back Pain  HPI  HPI: Marissa Cross is a 32 y.o. G3P3003 at 5 days postpartum who presents to maternity admissions reporting swelling in face, hands, and feet.  Pain in left ear with biting. And central lower back pain that is constant, sharp and throbbing that worsens with standing.  She reports good fetal movement, denies LOF, vaginal bleeding, vaginal itching/burning, urinary symptoms, h/a, dizziness, n/v, diarrhea, constipation or fever/chills.  She denies headache, visual changes or RUQ abdominal pain.   Past Medical History: Past Medical History:  Diagnosis Date   GDM (gestational diabetes mellitus) 11/2022   Placental abruption affecting delivery 10/15/2016   Seizure (HCC)    Vaginal Pap smear, abnormal     Past obstetric history: OB History  Gravida Para Term Preterm AB Living  3 3 3  0 0 3  SAB IAB Ectopic Multiple Live Births  0 0 0 0 3    # Outcome Date GA Lbr Len/2nd Weight Sex Type Anes PTL Lv  3 Term 01/08/23 [redacted]w[redacted]d 19:07 / 00:24 2720 g F Vag-Spont EPI  LIV  2 Term 10/15/16 [redacted]w[redacted]d  3205 g F CS-LTranv EPI  LIV  1 Term 08/26/10 [redacted]w[redacted]d  3515 g M Vag-Spont   LIV    Past Surgical History: Past Surgical History:  Procedure Laterality Date   CESAREAN SECTION N/A 10/15/2016   Procedure: CESAREAN SECTION;  Surgeon: Marissa Evans, MD;  Location: [facility] BIRTHING SUITES;  Service: Obstetrics;  Laterality: N/A;    Family History: Family History  Problem Relation Age of Onset   Diabetes Neg Hx    Hyperlipidemia Neg Hx     Social History: Social History   Tobacco Use   Smoking status: Never   Smokeless tobacco: Never  Vaping Use   Vaping status: Never Used  Substance Use Topics   Alcohol use: Not Currently    Comment: holidays only; not while preg   Drug use: No    Allergies: No Known Allergies  Meds:  Medications Prior to Admission  Medication Sig Dispense Refill Last Dose/Taking   acetaminophen (TYLENOL) 325 MG tablet Take 2  tablets (650 mg total) by mouth every 4 (four) hours as needed (for pain scale < 4). 60 tablet 1 01/13/2023 at 12:00 PM   ibuprofen (ADVIL) 600 MG tablet Take 1 tablet (600 mg total) by mouth every 6 (six) hours. 60 tablet 1    prenatal vitamin w/FE, FA (PRENATAL 1 + 1) 27-1 MG TABS tablet Take 1 tablet by mouth daily at 12 noon. 90 tablet 3    pyridOXINE (VITAMIN B6) 100 MG tablet Take 100 mg by mouth daily.      senna-docusate (SENOKOT-S) 8.6-50 MG tablet Take 2 tablets by mouth daily. 60 tablet 1    zonisamide (ZONEGRAN) 100 MG capsule Take 3 capsules every night 270 capsule 3     I have reviewed patient's Past Medical Hx, Surgical Hx, Family Hx, Social Hx, medications and allergies.   ROS:  Review of Systems Other systems negative  Physical Exam  Patient Vitals for the past 24 hrs:  BP Temp Temp src Pulse Resp SpO2 Height Weight  01/13/23 2016 (!) 145/88 -- -- (!) 49 -- -- -- --  01/13/23 2001 (!) 136/95 -- -- (!) 51 -- -- -- --  01/13/23 1842 (!) 140/82 -- -- (!) 53 -- -- -- --  01/13/23 1818 (!) 134/97 98.2 F (36.8 C) Oral 64 18 99 % -- --  01/13/23 1811 -- -- -- -- -- --  5\' 3"  (1.6 m) 91.6 kg   Constitutional: Well-developed, well-nourished female in no acute distress.  Cardiovascular: normal rate and rhythm Respiratory: normal effort, clear to auscultation bilaterally GI: Abd soft, non-tender, gravid appropriate for gestational age.   No rebound or guarding. MS: Extremities nontender, no edema, normal ROM Neurologic: Alert and oriented x 4.  GU: Neg CVAT.    Labs: Results for orders placed or performed during the hospital encounter of 01/13/23 (from the past 24 hours)  Urinalysis, Routine w reflex microscopic -Urine, Clean Catch     Status: Abnormal   Collection Time: 01/13/23  6:47 PM  Result Value Ref Range   Color, Urine YELLOW YELLOW   APPearance HAZY (A) CLEAR   Specific Gravity, Urine 1.010 1.005 - 1.030   pH 7.0 5.0 - 8.0   Glucose, UA NEGATIVE NEGATIVE mg/dL    Hgb urine dipstick LARGE (A) NEGATIVE   Bilirubin Urine NEGATIVE NEGATIVE   Ketones, ur NEGATIVE NEGATIVE mg/dL   Protein, ur 161 (A) NEGATIVE mg/dL   Nitrite NEGATIVE NEGATIVE   Leukocytes,Ua MODERATE (A) NEGATIVE   RBC / HPF >50 0 - 5 RBC/hpf   WBC, UA 21-50 0 - 5 WBC/hpf   Bacteria, UA NONE SEEN NONE SEEN   Squamous Epithelial / HPF 6-10 0 - 5 /HPF   Mucus PRESENT    --/--/O POS (12/24 1634)  Imaging:  VAS [place] UPPER EXTREMITY VENOUS DUPLEX Result Date: 01/08/2023 UPPER VENOUS STUDY  Patient Name:  Marissa Cross  Date of Exam:   01/08/2023 Medical Rec #: [id number]    Accession #:    [id number] Date of Birth: 09-25-1990    Patient Gender: F Patient Age:   89 years Exam Location:  [facility] Procedure:      VAS [place] UPPER EXTREMITY VENOUS DUPLEX Referring Phys: Wyn Forster --------------------------------------------------------------------------------  Indications: Pain Risk Factors: Past pregnancy. Comparison Study: None. Performing Technologist: Shona Simpson  Examination Guidelines: A complete evaluation includes B-mode imaging, spectral Doppler, color Doppler, and power Doppler as needed of all accessible portions of each vessel. Bilateral testing is considered an integral part of a complete examination. Limited examinations for reoccurring indications may be performed as noted.  Right Findings: +----------+------------+---------+-----------+----------+-------+ RIGHT     CompressiblePhasicitySpontaneousPropertiesSummary +----------+------------+---------+-----------+----------+-------+ IJV           Full       Yes       Yes                      +----------+------------+---------+-----------+----------+-------+ Subclavian    Full       Yes       Yes                      +----------+------------+---------+-----------+----------+-------+ Axillary      Full       Yes       Yes                       +----------+------------+---------+-----------+----------+-------+ Brachial      Full       Yes       Yes                      +----------+------------+---------+-----------+----------+-------+ Radial        Full       Yes       Yes                      +----------+------------+---------+-----------+----------+-------+  Ulnar         Full       Yes       Yes                      +----------+------------+---------+-----------+----------+-------+ Cephalic      Full                 Yes                      +----------+------------+---------+-----------+----------+-------+ Basilic       Full                 Yes                      +----------+------------+---------+-----------+----------+-------+  Left Findings: +----------+------------+---------+-----------+----------+-------+ LEFT      CompressiblePhasicitySpontaneousPropertiesSummary +----------+------------+---------+-----------+----------+-------+ Subclavian    Full       Yes       Yes                      +----------+------------+---------+-----------+----------+-------+  Summary:  Right: No evidence of deep vein thrombosis in the upper extremity. No evidence of superficial vein thrombosis in the upper extremity.  Left: No evidence of thrombosis in the subclavian.  *See table(s) above for measurements and observations.  Diagnosing physician: Gerarda Fraction Electronically signed by Gerarda Fraction on 01/08/2023 at 11:56:00 AM.    Final    [place] MFM OB FOLLOW UP Result Date: 12/24/2022 ----------------------------------------------------------------------  OBSTETRICS REPORT                    (Corrected Final 12/24/2022 04:24 pm) ---------------------------------------------------------------------- Patient Info  ID #:       [id number]                          D.O.B.:  [birth year] (32 yrs)(F)  Name:       Gus Puma                     Visit Date: 12/24/2022 03:35 pm  ---------------------------------------------------------------------- Performed By  Attending:        Lin Landsman      Ref. Address:     [address]                    [place]                                                             [place], [place]                                                             [address]  Performed By:     Isac Sarna        [facility]  Referred By:      [facility] ---------------------------------------------------------------------- Orders  #  Description                           Code        Ordered By  1  [place] MFM OB FOLLOW UP                   [phone]    Rosana Hoes ----------------------------------------------------------------------  #  Order #                     Accession #                Episode #  1  [id number]                   [id number]                 [id number] ---------------------------------------------------------------------- Indications  Obesity complicating pregnancy, third          O99.213  trimester (pregravid BMI 32)  History of cesarean delivery, currently        O34.219  pregnant  Genetic carrier (SMA)                          Z14.8  Medical complication of pregnancy (Seizure     O26.90  disorder)  Neg AFP, Patient states LR NIPS  Encounter for other antenatal screening        Z36.2  follow-up  [redacted] weeks gestation of pregnancy                Z3A.35  Gestational diabetes in pregnancy,             O24.419  unspecified control ---------------------------------------------------------------------- Fetal Evaluation  Num Of Fetuses:         1  Fetal Heart Rate(bpm):  144  Cardiac Activity:       Observed  Placenta:               Posterior  P. Cord Insertion:      Previously seen  Amniotic Fluid   AFI FV:      Within normal limits  AFI Sum(cm)     %Tile       Largest Pocket(cm)  13.51           46          3.94  RUQ(cm)       RLQ(cm)       LUQ(cm)  LLQ(cm)  3.94          3.76          2.63           3.18 ---------------------------------------------------------------------- Biometry  BPD:      89.7  mm     G. Age:  36w 2d         85  %    CI:        75.24   %    70 - 86                                                          FL/HC:      19.5   %    20.1 - 22.3  HC:       328   mm     G. Age:  37w 2d         73  %    HC/AC:      1.06        0.93 - 1.11  AC:      310.5  mm     G. Age:  35w 0d         57  %    FL/BPD:     71.3   %    71 - 87  FL:         64  mm     G. Age:  33w 0d          6  %    FL/AC:      20.6   %    20 - 24  Est. FW:    2526  gm      5 lb 9 oz     42  % ---------------------------------------------------------------------- OB History  Gravidity:    3  Living:       2 ---------------------------------------------------------------------- Gestational Age  LMP:           34w 1d        Date:  04/29/22                  EDD:   02/03/23  U/S Today:     35w 3d                                        EDD:   01/25/23  Best:          35w 0d     Det. By:  U/S  (08/21/22)          EDD:   01/28/23 ---------------------------------------------------------------------- Targeted Anatomy  Central Nervous System  Calvarium/Cranial V.:  Appears normal         Cereb./Vermis:          Previously seen  Cavum:                 Previously seen        [place]:         Previously seen  Lateral Ventricles:    Previously seen        Midline Falx:           Previously seen  Choroid Plexus:  Previously seen  Spine  Cervical:              Previously seen        Sacral:                 Previously seen  Thoracic:              Previously seen        Shape/Curvature:        Previously seen  Lumbar:                Previously seen  Head/Neck  Lips:                  Previously seen        Profile:                 Previously seen  Neck:                  Previously seen        Orbits/Eyes:            Previously seen  Nuchal Fold:           Not applicable         Mandible:               Previously seen  Nasal Bone:            Previously seen        Maxilla:                Previously seen  Thorax  4 Chamber View:        Previously seen        Interventr. Septum:     Previously seen  Cardiac Rhythm:        Normal                 Cardiac Axis:           Previously een  Cardiac Situs:         Previously seen        Diaphragm:              Appears normal  Rt Outflow Tract:      Previously seen        3 Vessel View:          Previously seen  Lt Outflow Tract:      Previously seen        3 V Trachea View:       Previously seen  Aortic Arch:           Previously seen        IVC:                    Previously seen  Ductal Arch:           Previously seen        Crossing:               Previously seen  SVC:                   Previously seen  Abdomen  Ventral Wall:          Previously seen        Lt Kidney:              Appears normal  Cord Insertion:        Previously seen  Rt Kidney:              Appears normal  Situs:                 Appears normal         Bladder:                Appears normal  Stomach:               Appears normal         Bowel:                  EB prev visualized  Extremities  Lt Humerus:            Previously seen        Lt Femur:               Previously seen  Rt Humerus:            Previously seen        Rt Femur:               Previously seen  Lt Forearm:            Previously seen        Lt Lower Leg:           Previously seen  Rt Forearm:            Previously seen        Rt Lower Leg:           Previously seen  Lt Hand:               Previously seen        Lt Foot:                Previously seen  Rt Hand:               Previously seen        Rt Foot:                Previously seen  Other  Umbilical Cord:        Previously seen        Genitalia:              Previously seen  ---------------------------------------------------------------------- Impression  Follow up growth due to A1GDM  Normal interval growth with measurements consistent with  dates  Good fetal movement and amniotic fluid volume ---------------------------------------------------------------------- Recommendations  Follow up growth as clinically indicated. ----------------------------------------------------------------------                    Lin Landsman, MD Electronically Signed Corrected Final Report  12/24/2022 04:24 pm ----------------------------------------------------------------------    MAU Course/MDM: I have reviewed the triage vital signs and the nursing notes.   Pertinent labs & imaging results that were available during my care of the patient were reviewed by me and considered in my medical decision making (see chart for details).      I have reviewed her medical records including past results, notes and treatments.   I have ordered labs and reviewed results.  NST reviewed  Treatments in MAU included CBC, CMP, Serial BP, Nifedipine, Lasix, Flexeril.    Assessment: 1. Postpartum hypertension   2. Non-recurrent acute serous otitis media of both ears   3. Acute midline low back pain, unspecified whether sciatica present   PP HTN - mild range - Start Procarida 30 XL and Lasix 20  mg - CBC pending - CMP pending  Serous otitis media - continue supportive care   Low back pain without LE weakness or radicular symptoms - Trial Flexeril  Plan: Care relinquished to Dr. Milon Dikes, MD [facility] Fellow, Faculty practice [facility]  01/13/2023 8:22 PM

## 2023-01-13 NOTE — MAU Note (Signed)
Marissa Cross is a 32 y.o. at Unknown here in MAU reporting: she's having swelling in face, hands and feet.  States she also is having pain in left ear when biting.  Denies BP elevation during pregnancy.  Also c/o center and lower back.  States back pain is constant, sharp & throbbing, worst with standing. Reports took Tylenol today @ noon, no relief S/P NVD 01/08/2023  LMP: NA Onset of complaint: last night Pain score: 9 Vitals:   01/13/23 1818  BP: (!) 134/97  Pulse: 64  Resp: 18  Temp: 98.2 F (36.8 C)  SpO2: 99%     FHT:NA Lab orders placed from triage: UA

## 2023-01-14 ENCOUNTER — Other Ambulatory Visit: Payer: Self-pay | Admitting: Obstetrics and Gynecology

## 2023-01-15 ENCOUNTER — Inpatient Hospital Stay (HOSPITAL_COMMUNITY): Payer: Medicaid Other

## 2023-01-15 ENCOUNTER — Inpatient Hospital Stay (HOSPITAL_COMMUNITY)
Admission: AD | Admit: 2023-01-15 | Discharge: 2023-01-15 | Disposition: A | Discharge status: Home / Self Care | Payer: Medicaid Other | Attending: Obstetrics and Gynecology | Admitting: Obstetrics and Gynecology

## 2023-01-15 ENCOUNTER — Other Ambulatory Visit: Payer: Self-pay

## 2023-01-15 ENCOUNTER — Encounter (HOSPITAL_COMMUNITY): Payer: Self-pay | Admitting: Obstetrics and Gynecology

## 2023-01-15 DIAGNOSIS — R519 Headache, unspecified: Secondary | ICD-10-CM | POA: Diagnosis not present

## 2023-01-15 DIAGNOSIS — O9089 Other complications of the puerperium, not elsewhere classified: Secondary | ICD-10-CM | POA: Insufficient documentation

## 2023-01-15 MED ORDER — DIPHENHYDRAMINE HCL 25 MG PO CAPS
25.0000 mg | ORAL_CAPSULE | Freq: Once | ORAL | Status: AC
  Administered 2023-01-15: 25 mg via ORAL
  Filled 2023-01-15: qty 1

## 2023-01-15 MED ORDER — METOCLOPRAMIDE HCL 10 MG PO TABS
10.0000 mg | ORAL_TABLET | Freq: Once | ORAL | Status: AC
  Administered 2023-01-15: 10 mg via ORAL
  Filled 2023-01-15: qty 1

## 2023-01-15 MED ORDER — ACETAMINOPHEN-CAFFEINE 500-65 MG PO TABS
2.0000 | ORAL_TABLET | Freq: Once | ORAL | Status: AC
  Administered 2023-01-15: 2 via ORAL
  Filled 2023-01-15: qty 2

## 2023-01-15 NOTE — MAU Note (Signed)
 RN called CT tech and informed them that patient is ready for CT. CT tech sending for patient transport now.

## 2023-01-15 NOTE — MAU Note (Signed)
 Patient transport tech at bedside to take patient to CT via wheelchair.

## 2023-01-15 NOTE — MAU Note (Signed)
.  Marissa Cross is a 33 y.o. at Unknown here in MAU reporting: Pt reports left sided numbness and pain. Pt reports a very bad headache and pressure behind left eye.   Onset of complaint: last night  Pain score: 10/10 There were no vitals filed for this visit.    Lab orders placed from triage:   ua

## 2023-01-15 NOTE — MAU Provider Note (Signed)
 History     CSN: [id number]  Arrival date and time: 01/15/23 9281   Event Date/Time   First Provider Initiated Contact with Patient 01/15/23 0805      Event Date/Time   First Provider Initiated Contact with Patient 01/15/23 0805      S [name] is a 33 y.o. G3P3003 presents to MAU today with complaint of worsening HA especially on the left side and behind her eyes, She is also still c/o swelling in her face and upper and lower extremities with numbness. She denies any visual changes, RUQ pain, Fever, Chills, and denies fever, chills. Reports no difficulty moving her extremities. Took tylenol  this AM ~ 0300 with no relief. She was seen in the MAU on 01/13/23 with similar c/o and due to mild range BP's and elevated labs was started on Procardia  30 XL and Lasix . She reports she has only taken one dose thus far. BP today was normotensive. Her PNC was complicated by seizure disorder in pregnancy, Obesity, Headaches, GDM, Cholestasis of pregnancy and PP hypertension   Rece[facility] care at [facility]. Prenatal/Postpartum records reviewed  Pertinent items noted in HPI and remainder of comprehensive ROS otherwise negative.   O BP 127/84   Pulse (!) 58   Temp 98.4 F (36.9 C) (Oral)   Resp 12   SpO2 99%  Physical Exam Vitals and nursing note reviewed.  Constitutional:      General: She is not in acute distress.    Appearance: She is obese.  Cardiovascular:     Rate and Rhythm: Normal rate.  Pulmonary:     Effort: Pulmonary effort is normal.  Abdominal:     Palpations: Abdomen is soft.  Musculoskeletal:        General: Normal range of motion.  Skin:    General: Skin is warm and dry.  Neurological:     Mental Status: She is alert and oriented to person, place, and time.     Cranial Nerves: No facial asymmetry.     Motor: No weakness.     Deep Tendon Reflexes: Reflexes normal.  Psychiatric:        Mood and Affect: Mood is anxious.        Speech: Speech normal.        Behavior:  Behavior normal.     CT Head Wo Contrast       CLINICAL DATA:  Headache with increased frequency or severity.  EXAM: CT HEAD WITHOUT CONTRAST  TECHNIQUE: Contiguous axial images were obtained from the base of the skull through the vertex without intravenous contrast.  RADIATION DOSE REDUCTION: This exam was performed according to the departmental dose-optimization program which includes automated exposure control, adjustment of the mA and/or kV according to patient size and/or use of iterative reconstruction technique.  COMPARISON:  02/21/2017.  Brain MRI 02/19/2021  FINDINGS: Brain: No evidence of acute infarction, hemorrhage, hydrocephalus, extra-axial collection or mass lesion/mass effect.  Vascular: No hyperdense vessel or unexpected calcification.  Skull: Normal. Negative for fracture or focal lesion.  Sinuses/Orbits: No acute finding.  Other: Streak artifact from the patient's earrings.  IMPRESSION: Negative head CT.   Electronically Signed   By: Marissa Cross M.D.   On: 01/15/2023 09:20       MDM:  Moderate   MAU Course: Patient seen and evaluated by me, Medications ordered for HA, CT of Head to r/o acute pathology , Reevaluated  s/p meds and after CT results were reviewed and interpreted no evidence of acute  pathology, Counseled on medications and offered to change procardia  to a different antihypertensive but patient declined and will continue meds as previously prescribed on Procardia  and Lasix . Patient verbalized understanding and has f/u scheduled   A  HA in postpartum  - No acute pathology - BP's stable - Excedrin , Reglan  and Benadryl  PO  - CT of the head unremarkable   Postpartum period - Stable for discharge home  - Continue meds    P Discharge from MAU in stable condition with  precautions Follow up at [facility]  as scheduled for postpartum appt  Future Appointments  Date Time Provider Department Center  02/14/2023 11:30 AM  Marissa Darice HERO, MD [facility] None   Allergies as of 01/15/2023   No Known Allergies      Medication List     TAKE these medications    acetaminophen  325 MG tablet Commonly known as: Tylenol  Take 2 tablets (650 mg total) by mouth every 4 (four) hours as needed (for pain scale < 4).   furosemide  20 MG tablet Commonly known as: LASIX  Take 1 tablet (20 mg total) by mouth daily.   ibuprofen  600 MG tablet Commonly known as: ADVIL  Take 1 tablet (600 mg total) by mouth every 6 (six) hours.   NIFEdipine  30 MG 24 hr tablet Commonly known as: ADALAT  CC Take 1 tablet (30 mg total) by mouth daily.   prenatal vitamin w/FE, FA 27-1 MG Tabs tablet Take 1 tablet by mouth daily at 12 noon.   pyridOXINE 100 MG tablet Commonly known as: VITAMIN B6 Take 100 mg by mouth daily.   senna-docusate 8.6-50 MG tablet Commonly known as: Senokot-S Take 2 tablets by mouth daily.   zonisamide  100 MG capsule Commonly known as: ZONEGRAN  Take 3 capsules every night        Marissa Cross LABOR, NP 01/15/2023 10:22 AM    Cross LABOR Marissa Cross 01/15/2023, 10:22 AM

## 2023-01-17 ENCOUNTER — Telehealth (HOSPITAL_COMMUNITY): Payer: Self-pay | Admitting: *Deleted

## 2023-01-17 NOTE — Telephone Encounter (Signed)
 Attempted hospital discharge follow-up call. Left message for patient to return RN call with any questions or concerns. Deforest Hoyles, RN, 01/17/23, [phone]

## 2023-01-21 ENCOUNTER — Ambulatory Visit (INDEPENDENT_AMBULATORY_CARE_PROVIDER_SITE_OTHER): Payer: Medicaid Other

## 2023-01-21 ENCOUNTER — Other Ambulatory Visit: Payer: Self-pay

## 2023-01-21 VITALS — BP 127/83 | HR 69 | Ht 64.0 in | Wt 187.0 lb

## 2023-01-21 DIAGNOSIS — O165 Unspecified maternal hypertension, complicating the puerperium: Secondary | ICD-10-CM

## 2023-01-21 DIAGNOSIS — Z0130 Encounter for examination of blood pressure without abnormal findings: Secondary | ICD-10-CM

## 2023-01-21 NOTE — Progress Notes (Signed)
 Blood Pressure Check Visit  Marissa Cross is here for blood pressure check following spontaneous vaginal birth on 01/08/23. Patient had PP HTN. BP today is 127/83. Patient endorses slight headache. Patient reports having BP cuff at home. Educated how to take accurate BP at home and to go the to the MAU if she has elevated BP and/or symptoms worsens.   Patient is also here for repeat lab work per physician in the MAU when she was seen 01/13/23. CMP and CBC collected.  Patient has an PP appt on 02/24/2023 at 10:35. Patient confirmed scheduled appointment.    Rosaline Pendleton, RN 01/21/2023  2:29 PM

## 2023-01-22 LAB — CBC
Hematocrit: 43.4 % (ref 34.0–46.6)
Hemoglobin: 14.7 g/dL (ref 11.1–15.9)
MCH: 29.9 pg (ref 26.6–33.0)
MCHC: 33.9 g/dL (ref 31.5–35.7)
MCV: 88 fL (ref 79–97)
Platelets: 305 10*3/uL (ref 150–450)
RBC: 4.92 x10E6/uL (ref 3.77–5.28)
RDW: 13.6 % (ref 11.7–15.4)
WBC: 3 10*3/uL — ABNORMAL LOW (ref 3.4–10.8)

## 2023-01-22 LAB — COMPREHENSIVE METABOLIC PANEL
ALT: 41 [IU]/L — ABNORMAL HIGH (ref 0–32)
AST: 28 [IU]/L (ref 0–40)
Albumin: 4.1 g/dL (ref 3.9–4.9)
Alkaline Phosphatase: 120 [IU]/L (ref 44–121)
BUN/Creatinine Ratio: 11 (ref 9–23)
BUN: 7 mg/dL (ref 6–20)
Bilirubin Total: 0.4 mg/dL (ref 0.0–1.2)
CO2: 21 mmol/L (ref 20–29)
Calcium: 9.2 mg/dL (ref 8.7–10.2)
Chloride: 106 mmol/L (ref 96–106)
Creatinine, Ser: 0.66 mg/dL (ref 0.57–1.00)
Globulin, Total: 1.8 g/dL (ref 1.5–4.5)
Glucose: 87 mg/dL (ref 70–99)
Potassium: 3.6 mmol/L (ref 3.5–5.2)
Sodium: 143 mmol/L (ref 134–144)
Total Protein: 5.9 g/dL — ABNORMAL LOW (ref 6.0–8.5)
eGFR: 119 mL/min/{1.73_m2} (ref >59)

## 2023-02-14 ENCOUNTER — Ambulatory Visit: Payer: Medicaid Other | Admitting: Neurology

## 2023-02-14 ENCOUNTER — Encounter: Payer: Self-pay | Admitting: Neurology

## 2023-02-14 VITALS — BP 100/66 | HR 74 | Ht 64.0 in | Wt 191.0 lb

## 2023-02-14 DIAGNOSIS — R404 Transient alteration of awareness: Secondary | ICD-10-CM | POA: Diagnosis not present

## 2023-02-14 DIAGNOSIS — R400 Somnolence: Secondary | ICD-10-CM | POA: Diagnosis not present

## 2023-02-14 DIAGNOSIS — G43009 Migraine without aura, not intractable, without status migrainosus: Secondary | ICD-10-CM | POA: Diagnosis not present

## 2023-02-14 DIAGNOSIS — R519 Headache, unspecified: Secondary | ICD-10-CM

## 2023-02-14 MED ORDER — ZONISAMIDE 100 MG PO CAPS: ORAL_CAPSULE | ORAL | 3 refills | Status: AC

## 2023-02-14 NOTE — Patient Instructions (Signed)
Good to see you. Congratulations!  Restart Zonisamide 100mg : Take 3 capsules every night  2. Minimize Tylenol to 2-3 a week to avoid rebound headaches  3. Our office will call in a month to see how you are doing with sleep, if better, we will plan for sleep study at that point  4. Follow-up in 4-5 months, call for any changes   Seizure Precautions: 1. If medication has been prescribed for you to prevent seizures, take it exactly as directed.  Do not stop taking the medicine without talking to your doctor first, even if you have not had a seizure in a long time.   2. Avoid activities in which a seizure would cause danger to yourself or to others.  Don't operate dangerous machinery, swim alone, or climb in high or dangerous places, such as on ladders, roofs, or girders.  Do not drive unless your doctor says you may.  3. If you have any warning that you may have a seizure, lay down in a safe place where you can't hurt yourself.    4.  No driving for 6 months from last seizure, as per [facility].   Please refer to the following link on the Epile[employer] of [employer]'s website for more information: http://www.epilepsyfoundation.org/answerplace/Social/driving/drivingu.cfm   5.  Maintain good sleep hygiene. Avoid alcohol.  6.  Notify your neurology if you are planning pregnancy or if you become pregnant.  7.  Contact your doctor if you have any problems that may be related to the medicine you are taking.  8.  Call 911 and bring the patient back to the ED if:        A.  The seizure lasts longer than 5 minutes.       B.  The patient doesn't awaken shortly after the seizure  C.  The patient has new problems such as difficulty seeing, speaking or moving  D.  The patient was injured during the seizure  E.  The patient has a temperature over 102 F (39C)  F.  The patient vomited and now is having trouble breathing
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
## 2023-02-24 ENCOUNTER — Other Ambulatory Visit: Payer: Self-pay

## 2023-02-24 ENCOUNTER — Encounter: Payer: Self-pay | Admitting: Obstetrics & Gynecology

## 2023-02-24 ENCOUNTER — Other Ambulatory Visit (HOSPITAL_COMMUNITY)
Admission: RE | Admit: 2023-02-24 | Discharge: 2023-02-24 | Disposition: A | Discharge status: Home / Self Care | Payer: Medicaid Other | Source: Ambulatory Visit | Attending: Obstetrics & Gynecology | Admitting: Obstetrics & Gynecology

## 2023-02-24 ENCOUNTER — Ambulatory Visit: Payer: Medicaid Other | Admitting: Obstetrics & Gynecology

## 2023-02-24 DIAGNOSIS — Z8632 Personal history of gestational diabetes: Secondary | ICD-10-CM | POA: Diagnosis not present

## 2023-02-24 DIAGNOSIS — Z124 Encounter for screening for malignant neoplasm of cervix: Secondary | ICD-10-CM | POA: Diagnosis present

## 2023-02-24 NOTE — Patient Instructions (Signed)
 [employer]

## 2023-02-24 NOTE — Progress Notes (Signed)
 Post Partum Visit Note  Marissa Cross is a 33 y.o. [id number] female who presents for a postpartum visit. She is 6 weeks postpartum following a normal spontaneous vaginal delivery.  I have fully reviewed the prenatal and intrapartum course, patient had GDM and cholestasis. The delivery was at [redacted]w[redacted]d gestational weeks.  Anesthesia: epidural. Postpartum course has been uncomplicated. Baby is doing well. Baby is feeding by breast. Bleeding no bleeding. Bowel function is normal. Bladder function is normal. Patient is not sexually active. Contraception method is  unsure . Postpartum depression screening: negative.   Upstream - 02/24/23 1102       Pregnancy Intention Screening   Does the patient want to become pregnant in the next year? No    Does the patient's partner want to become pregnant in the next year? No    Would the patient like to discuss contraceptive options today? Yes      Contraception Wrap Up   Current Method No Contraceptive Precautions    End Method No Contraception Precautions    Contraception Counseling Provided No            The pregnancy intention screening data noted above was reviewed. Potential methods of contraception were discussed. The patient elected to proceed with No Contraception Precautions.   Edinburgh Postnatal Depression Scale - 02/24/23 1101       Edinburgh Postnatal Depression Scale:  In the Past 7 Days   I have been able to laugh and see the funny side of things. 0    I have looked forward with enjoyment to things. 0    I have blamed myself unnecessarily when things went wrong. 0    I have been anxious or worried for no good reason. 0    I have felt scared or panicky for no good reason. 0    Things have been getting on top of me. 0    I have been so unhappy that I have had difficulty sleeping. 0    I have felt sad or miserable. 0    I have been so unhappy that I have been crying. 0    The thought of harming myself has occurred to me. 0     Edinburgh Postnatal Depression Scale Total 0             Health Maintenance Due  Topic Date Due   DTaP/Tdap/Td (1 - Tdap) Never done   Cervical Cancer Screening (HPV/Pap Cotest)  02/01/2022   INFLUENZA VACCINE  08/15/2022   COVID-19 Vaccine (5 - 2024-25 season) 09/15/2022    The following portions of the patient's history were reviewed and updated as appropriate: allergies, current medications, past family history, past medical history, past social history, past surgical history, and problem list.  Review of Systems Pertinent items noted in HPI and remainder of comprehensive ROS otherwise negative.  Objective: (done with RN present as chaperone)  BP 108/80   Pulse 81   Wt 186 lb (84.4 kg)   Breastfeeding Yes   BMI 31.93 kg/m    General:  alert and no distress   Breasts:  normal  Lungs: clear to auscultation bilaterally  Heart:  regular rate and rhythm, S1, S2 normal, no murmur, click, rub or gallop  Abdomen: soft, non-tender; bowel sounds normal; no masses,  no organomegaly   GU exam:  normal, pap smear done       Assessment:   1. History of gestational diabetes - Glucose tolerance, 2 hours; Future  2. Encounter  for Papanicolaou smear for cervical cancer screening - Cytology - PAP( [place])  3. Postpartum care following vaginal delivery (Primary) Plan:   Essential components of care per ACOG recommendations:  1.  Mood and well being: Patient with negative depression screening today. Reviewed local resources for support.  - Patient tobacco use? No.   - hx of drug use? No.    2. Infant care and feeding:  -Patient currently breastmilk feeding? Yes. Reviewed importance of draining breast regularly to support lactation.  -Social determinants of health (SDOH) reviewed in EPIC. No concerns.  3. Sexuality, contraception and birth spacing - Patient does not want a pregnancy in the next year.  - Reviewed reproductive life planning. Reviewed contraceptive methods  based on pt preferences and effectiveness.  Patient is undecided for now, considering Slynd as she is breastfeeding. - Discussed birth spacing of 18 months  4. Sleep and fatigue -Encouraged family/partner/community support of 4 hrs of uninterrupted sleep to help with mood and fatigue  5. Physical Recovery  - Discussed patients delivery and complications. She describes her labor as good. - Patient had a Vaginal, no problems at delivery. Patient had no laceration. Perineal healing reviewed. Patient expressed understanding - Patient has urinary incontinence? No. - Patient is safe to resume physical and sexual activity  6.  Health Maintenance - HM due items addressed Yes - Last pap smear  Diagnosis  Date Value Ref Range Status  02/02/2019   Final   - Negative for intraepithelial lesion or malignancy (NILM)   Pap smear done at today's visit.  -Breast Cancer screening indicated? No.   7. Chronic Disease/Pregnancy Condition follow up: Gestational Diabetes - She will return for postpartum 2 hr GTT soon - PCP follow up   Milcah Dulany, MD [facility], [facility]

## 2023-02-27 LAB — CYTOLOGY - PAP
Comment: NEGATIVE
Diagnosis: UNDETERMINED — AB
High risk HPV: NEGATIVE

## 2023-02-28 ENCOUNTER — Encounter: Payer: Self-pay | Admitting: Obstetrics & Gynecology

## 2023-03-04 ENCOUNTER — Other Ambulatory Visit: Payer: Self-pay

## 2023-03-04 ENCOUNTER — Other Ambulatory Visit: Payer: Medicaid Other

## 2023-03-04 DIAGNOSIS — Z8632 Personal history of gestational diabetes: Secondary | ICD-10-CM

## 2023-03-05 ENCOUNTER — Encounter: Payer: Self-pay | Admitting: Obstetrics & Gynecology

## 2023-03-05 LAB — GLUCOSE TOLERANCE, 2 HOURS
Glucose, 2 hour: 55 mg/dL — ABNORMAL LOW (ref 70–139)
Glucose, GTT - Fasting: 80 mg/dL (ref 70–99)

## 2023-03-16 ENCOUNTER — Other Ambulatory Visit: Payer: Self-pay | Admitting: Obstetrics and Gynecology

## 2023-05-20 ENCOUNTER — Encounter: Payer: Self-pay | Admitting: Obstetrics & Gynecology

## 2023-06-18 ENCOUNTER — Ambulatory Visit: Payer: Medicaid Other | Admitting: Neurology

## 2023-07-21 ENCOUNTER — Other Ambulatory Visit: Payer: Self-pay

## 2023-07-21 ENCOUNTER — Emergency Department (HOSPITAL_COMMUNITY)

## 2023-07-21 ENCOUNTER — Emergency Department (HOSPITAL_COMMUNITY)
Admission: EM | Admit: 2023-07-21 | Discharge: 2023-07-21 | Disposition: A | Discharge status: Home / Self Care | Attending: Emergency Medicine | Admitting: Emergency Medicine

## 2023-07-21 DIAGNOSIS — N939 Abnormal uterine and vaginal bleeding, unspecified: Secondary | ICD-10-CM | POA: Insufficient documentation

## 2023-07-21 DIAGNOSIS — E119 Type 2 diabetes mellitus without complications: Secondary | ICD-10-CM | POA: Diagnosis not present

## 2023-07-21 LAB — COMPREHENSIVE METABOLIC PANEL WITH GFR
ALT: 16 U/L (ref 0–44)
AST: 17 U/L (ref 15–41)
Albumin: 4.1 g/dL (ref 3.5–5.0)
Alkaline Phosphatase: 76 U/L (ref 38–126)
Anion gap: 12 (ref 5–15)
BUN: 9 mg/dL (ref 6–20)
CO2: 21 mmol/L — ABNORMAL LOW (ref 22–32)
Calcium: 9.4 mg/dL (ref 8.9–10.3)
Chloride: 106 mmol/L (ref 98–111)
Creatinine, Ser: 0.67 mg/dL (ref 0.44–1.00)
GFR, Estimated: >60 mL/min (ref >60)
Glucose, Bld: 93 mg/dL (ref 70–99)
Potassium: 3.5 mmol/L (ref 3.5–5.1)
Sodium: 139 mmol/L (ref 135–145)
Total Bilirubin: 0.4 mg/dL (ref 0.0–1.2)
Total Protein: 6.5 g/dL (ref 6.5–8.1)

## 2023-07-21 LAB — URINALYSIS, ROUTINE W REFLEX MICROSCOPIC
Bacteria, UA: NONE SEEN
Bilirubin Urine: NEGATIVE
Glucose, UA: NEGATIVE mg/dL
Ketones, ur: NEGATIVE mg/dL
Leukocytes,Ua: NEGATIVE
Nitrite: NEGATIVE
Protein, ur: NEGATIVE mg/dL
RBC / HPF: >50 RBC/hpf (ref 0–5)
Specific Gravity, Urine: 1.01 (ref 1.005–1.030)
pH: 6 (ref 5.0–8.0)

## 2023-07-21 LAB — TYPE AND SCREEN
ABO/RH(D): O POS
Antibody Screen: NEGATIVE

## 2023-07-21 LAB — I-STAT CG4 LACTIC ACID, ED: Lactic Acid, Venous: 1.5 mmol/L (ref 0.5–1.9)

## 2023-07-21 LAB — HCG, SERUM, QUALITATIVE: Preg, Serum: NEGATIVE

## 2023-07-21 LAB — CBC
HCT: 42.2 % (ref 36.0–46.0)
Hemoglobin: 14.4 g/dL (ref 12.0–15.0)
MCH: 30.4 pg (ref 26.0–34.0)
MCHC: 34.1 g/dL (ref 30.0–36.0)
MCV: 89.2 fL (ref 80.0–100.0)
Platelets: 254 K/uL (ref 150–400)
RBC: 4.73 MIL/uL (ref 3.87–5.11)
RDW: 11.8 % (ref 11.5–15.5)
WBC: 4.9 K/uL (ref 4.0–10.5)
nRBC: 0 % (ref 0.0–0.2)

## 2023-07-21 MED ORDER — NAPROXEN 375 MG PO TABS: 375.0000 mg | ORAL_TABLET | Freq: Two times a day (BID) | ORAL | 0 refills | Status: AC

## 2023-07-21 MED ORDER — MORPHINE SULFATE (PF) 4 MG/ML IV SOLN
4.0000 mg | Freq: Once | INTRAVENOUS | Status: DC
  Filled 2023-07-21: qty 1

## 2023-07-21 MED ORDER — LACTATED RINGERS IV BOLUS
1000.0000 mL | Freq: Once | INTRAVENOUS | Status: AC
  Administered 2023-07-21: 1000 mL via INTRAVENOUS

## 2023-07-21 MED ORDER — FENTANYL CITRATE PF 50 MCG/ML IJ SOSY
50.0000 ug | PREFILLED_SYRINGE | Freq: Once | INTRAMUSCULAR | Status: AC
  Administered 2023-07-21: 50 ug via INTRAVENOUS
  Filled 2023-07-21: qty 1

## 2023-07-21 NOTE — Discharge Instructions (Signed)
 Take the medications as needed for cramping and pain.  Follow-up with your OB/GYN doctor to be rechecked

## 2023-07-21 NOTE — ED Notes (Signed)
 Questions and concerns addressed. Discharge teaching completed.   Prescriptions reviewed and pharmacy verified.   Reinforced education on not breastfeeding for 10-16 hours after IV pain medication given, per pharmacy. Patient verbalized understanding.   Pt ambulatory upon discharge and discharged with family.

## 2023-07-21 NOTE — ED Provider Triage Note (Signed)
 Emergency Medicine Provider Triage Evaluation Note  Marissa Cross , a 33 y.o. female  was evaluated in triage.  Pt complains of vaginal bleeding, lower abdominal pain.  Reports just give birth in December, this is her first normal.  Since she gave birth.  Reports severe pain, heavy bleeding, has gone through multiple menstrual pads in the last 4 hours..  Review of Systems  Positive: Lower abdominal pain, vaginal bleeding Negative:   Physical Exam  BP (!) 98/50 (BP Location: Right Arm)   Pulse 81   Temp 97.9 F (36.6 C) (Oral)   Resp 18   Wt 84 kg   SpO2 100%   BMI 31.79 kg/m  Gen:   Awake, no distress   Resp:  Normal effort  MSK:   Moves extremities without difficulty  Other:  Focal ttp in suprapubic region, some guarding, no rebound, rigidity  Medical Decision Making  Medically screening exam initiated at 7:00 PM.  Appropriate orders placed.  Marissa Cross was informed that the remainder of the evaluation will be completed by another provider, this initial triage assessment does not replace that evaluation, and the importance of remaining in the ED until their evaluation is complete.  Workup initiated in triage    Rosan Sherlean DEL, [place] 07/21/23 1900

## 2023-07-21 NOTE — ED Provider Notes (Signed)
 [place] [facility]ERGENCY DEPARTMENT AT [facility] Provider Note   CSN: [id number] Arrival date & time: 07/21/23  1840     Patient presents with: Vaginal Bleeding   Marissa Cross is a 33 y.o. female.    Vaginal Bleeding    Patient has history of seizures, diabetes, placental abruption.  Patient delivered child on December 24 of last year.  Patient started having her first menstrual period a couple weeks ago.  Patient states however she started bleeding again 2 days ago.  She has been having very severe pressure and discomfort in her vaginal and rectal area.  Patient states she has been passing clots of blood.  Patient states she took Tylenol  last night for pain.  She has not taken anything today.  The pain discomfort is severe.  No fevers or chills.  Prior to Admission medications   Medication Sig Start Date End Date Taking? Authorizing Provider  naproxen  (NAPROSYN ) 375 MG tablet Take 1 tablet (375 mg total) by mouth 2 (two) times daily. 07/21/23  Yes Randol Simmonds, MD  acetaminophen  (TYLENOL ) 325 MG tablet Take 2 tablets (650 mg total) by mouth every 4 (four) hours as needed (for pain scale < 4). 01/10/23   Nicholaus Almarie HERO, MD  ibuprofen  (ADVIL ) 600 MG tablet Take 1 tablet (600 mg total) by mouth every 6 (six) hours. 01/10/23   Nicholaus Almarie HERO, MD  NIFEdipine  (ADALAT  CC) 30 MG 24 hr tablet Take 1 tablet (30 mg total) by mouth daily. Patient not taking: Reported on 02/24/2023 01/14/23   Erik Kieth BROCKS, MD  prenatal vitamin w/FE, FA (PRENATAL 1 + 1) 27-1 MG TABS tablet Take 1 tablet by mouth daily at 12 noon. 09/25/22   Fredirick Glenys RAMAN, MD  pyridOXINE (VITAMIN B6) 100 MG tablet Take 100 mg by mouth daily.    [provider]  senna-docusate (SENOKOT-S) 8.6-50 MG tablet Take 2 tablets by mouth daily. 01/10/23   Nicholaus Almarie HERO, MD  zonisamide  (ZONEGRAN ) 100 MG capsule Take 3 capsules every night 02/14/23   Georjean Darice HERO, MD    Allergies: Patient has no known allergies.     Review of Systems  Genitourinary:  Positive for vaginal bleeding.    Updated Vital Signs BP 122/77   Pulse (!) 43   Temp 97.9 F (36.6 C) (Oral)   Resp 13   Wt 84 kg   SpO2 100%   BMI 31.79 kg/m   Physical Exam Vitals and nursing note reviewed.  Constitutional:      General: She is not in acute distress.    Appearance: She is well-developed.  HENT:     Head: Normocephalic and atraumatic.     Right Ear: External ear normal.     Left Ear: External ear normal.  Eyes:     General: No scleral icterus.       Right eye: No discharge.        Left eye: No discharge.     Conjunctiva/sclera: Conjunctivae normal.  Neck:     Trachea: No tracheal deviation.  Cardiovascular:     Rate and Rhythm: Normal rate and regular rhythm.  Pulmonary:     Effort: Pulmonary effort is normal. No respiratory distress.     Breath sounds: Normal breath sounds. No stridor. No wheezing or rales.  Abdominal:     General: Bowel sounds are normal. There is no distension.     Palpations: Abdomen is soft.     Tenderness: There is abdominal tenderness. There is  no guarding or rebound.  Musculoskeletal:        General: No tenderness or deformity.     Cervical back: Neck supple.  Skin:    General: Skin is warm and dry.     Findings: No rash.  Neurological:     General: No focal deficit present.     Mental Status: She is alert.     Cranial Nerves: No cranial nerve deficit, dysarthria or facial asymmetry.     Sensory: No sensory deficit.     Motor: No abnormal muscle tone or seizure activity.     Coordination: Coordination normal.  Psychiatric:        Mood and Affect: Mood normal.     (all labs ordered are listed, but only abnormal results are displayed) Labs Reviewed  COMPREHENSIVE METABOLIC PANEL WITH GFR - Abnormal; Notable for the following components:      Result Value   CO2 21 (*)    All other components within normal limits  URINALYSIS, ROUTINE W REFLEX MICROSCOPIC - Abnormal; Notable  for the following components:   Hgb urine dipstick LARGE (*)    All other components within normal limits  HCG, SERUM, QUALITATIVE  CBC  I-STAT CG4 LACTIC ACID, ED  I-STAT CG4 LACTIC ACID, ED  TYPE AND SCREEN    EKG: None  Radiology: US  Pelvis Complete Result Date: 07/21/2023 CLINICAL DATA:  Pelvic pain, vaginal bleeding EXAM: TRANSABDOMINAL AND TRANSVAGINAL ULTRASOUND OF PELVIS DOPPLER ULTRASOUND OF OVARIES TECHNIQUE: Both transabdominal and transvaginal ultrasound examinations of the pelvis were performed. Transabdominal technique was performed for global imaging of the pelvis including uterus, ovaries, adnexal regions, and pelvic cul-de-sac. It was necessary to proceed with endovaginal exam following the transabdominal exam to visualize the uterus, endometrium, ovaries, and adnexa. Color and duplex Doppler ultrasound was utilized to evaluate blood flow to the ovaries. COMPARISON:  None Available. FINDINGS: Uterus Measurements: 11.4 x 5.1 x 5.8 cm = volume: 177 mL. No fibroids or other mass visualized. Endometrium Thickness: 1.4 cm.  No focal abnormality visualized. Right ovary Measurements: 2.5 x 2.3 x 2.8 cm = volume: 8 mL. Normal appearance/no adnexal mass. Small follicles. Left ovary Measurements: 4.5 x 2.0 x 2.7 cm = volume: 13 mL. Normal appearance/no adnexal mass. Small follicles. Pulsed Doppler evaluation of both ovaries demonstrates normal low-resistance arterial and venous waveforms. Other findings Trace free fluid in the low pelvis. IMPRESSION: 1. No ultrasound abnormality of the pelvis to explain pain. 2. Trace free fluid in the low pelvis, likely physiologic. 3. Normal arterial and venous Doppler flow to the bilateral ovaries. Electronically Signed   By: Marolyn JONETTA Jaksch M.D.   On: 07/21/2023 20:30   US  Transvaginal Non-OB Result Date: 07/21/2023 CLINICAL DATA:  Pelvic pain, vaginal bleeding EXAM: TRANSABDOMINAL AND TRANSVAGINAL ULTRASOUND OF PELVIS DOPPLER ULTRASOUND OF OVARIES  TECHNIQUE: Both transabdominal and transvaginal ultrasound examinations of the pelvis were performed. Transabdominal technique was performed for global imaging of the pelvis including uterus, ovaries, adnexal regions, and pelvic cul-de-sac. It was necessary to proceed with endovaginal exam following the transabdominal exam to visualize the uterus, endometrium, ovaries, and adnexa. Color and duplex Doppler ultrasound was utilized to evaluate blood flow to the ovaries. COMPARISON:  None Available. FINDINGS: Uterus Measurements: 11.4 x 5.1 x 5.8 cm = volume: 177 mL. No fibroids or other mass visualized. Endometrium Thickness: 1.4 cm.  No focal abnormality visualized. Right ovary Measurements: 2.5 x 2.3 x 2.8 cm = volume: 8 mL. Normal appearance/no adnexal mass. Small follicles. Left ovary Measurements:  4.5 x 2.0 x 2.7 cm = volume: 13 mL. Normal appearance/no adnexal mass. Small follicles. Pulsed Doppler evaluation of both ovaries demonstrates normal low-resistance arterial and venous waveforms. Other findings Trace free fluid in the low pelvis. IMPRESSION: 1. No ultrasound abnormality of the pelvis to explain pain. 2. Trace free fluid in the low pelvis, likely physiologic. 3. Normal arterial and venous Doppler flow to the bilateral ovaries. Electronically Signed   By: Marolyn JONETTA Jaksch M.D.   On: 07/21/2023 20:30   US  Art/Ven Flow Abd Pelv Doppler Result Date: 07/21/2023 CLINICAL DATA:  Pelvic pain, vaginal bleeding EXAM: TRANSABDOMINAL AND TRANSVAGINAL ULTRASOUND OF PELVIS DOPPLER ULTRASOUND OF OVARIES TECHNIQUE: Both transabdominal and transvaginal ultrasound examinations of the pelvis were performed. Transabdominal technique was performed for global imaging of the pelvis including uterus, ovaries, adnexal regions, and pelvic cul-de-sac. It was necessary to proceed with endovaginal exam following the transabdominal exam to visualize the uterus, endometrium, ovaries, and adnexa. Color and duplex Doppler ultrasound was  utilized to evaluate blood flow to the ovaries. COMPARISON:  None Available. FINDINGS: Uterus Measurements: 11.4 x 5.1 x 5.8 cm = volume: 177 mL. No fibroids or other mass visualized. Endometrium Thickness: 1.4 cm.  No focal abnormality visualized. Right ovary Measurements: 2.5 x 2.3 x 2.8 cm = volume: 8 mL. Normal appearance/no adnexal mass. Small follicles. Left ovary Measurements: 4.5 x 2.0 x 2.7 cm = volume: 13 mL. Normal appearance/no adnexal mass. Small follicles. Pulsed Doppler evaluation of both ovaries demonstrates normal low-resistance arterial and venous waveforms. Other findings Trace free fluid in the low pelvis. IMPRESSION: 1. No ultrasound abnormality of the pelvis to explain pain. 2. Trace free fluid in the low pelvis, likely physiologic. 3. Normal arterial and venous Doppler flow to the bilateral ovaries. Electronically Signed   By: Marolyn JONETTA Jaksch M.D.   On: 07/21/2023 20:30     Procedures   Medications Ordered in the ED  lactated ringers  bolus 1,000 mL (0 mLs Intravenous Stopped 07/21/23 2117)  fentaNYL  (SUBLIMAZE ) injection 50 mcg (50 mcg Intravenous Given 07/21/23 2041)    Clinical Course as of 07/21/23 2140  Mon Jul 21, 2023  2104 CBC CBC normal. [JK]  2104 Comprehensive metabolic panel(!) Normal [JK]  2104 hCG, serum, qualitative Negative [JK]  2104 Ultrasound without acute abnormality [JK]    Clinical Course User Index [JK] Randol Simmonds, MD                                 Medical Decision Making Amount and/or Complexity of Data Reviewed Labs: ordered. Decision-making details documented in ED Course.  Risk Prescription drug management.   Patient presented to the emergency room for vaginal bleeding lower abdominal pain.  Patient's ED workup without signs of anemia.  No signs of severe bleeding on exam.  Pelvic ultrasound does not show any signs of ovarian torsion pelvic mass or other acute abnormality.  Suspect patient's pain and cramping is related to her irregular  menstrual bleeding.  Patient initially noted to be mildly hypotensive but her blood pressures improved with hydration.  Patient does not have any vomiting or diarrhea.  She is feeling better.  Findings not suggestive of acute infection.  At this time suspect her symptoms are related to her irregular menstrual bleeding.  Will have her follow-up with her OB/GYN.     Final diagnoses:  Vaginal bleeding    ED Discharge Orders  Ordered    naproxen  (NAPROSYN ) 375 MG tablet  2 times daily        07/21/23 2139               Randol Simmonds, MD 07/21/23 2140

## 2023-07-21 NOTE — ED Triage Notes (Signed)
 BIB EMS for vaginal bleeding x 2 days. Pt is breast feeding has a 14 month old at home. Pt has pressure in vagina and rectum. Has bleed through multiple pads passing a lot of clots with using bathroom. Pt had first period 2 weeks ago since having child. Pt has not taken pregnancy test.   CBG 87 120/76 80 hr 100% ra

## 2023-08-25 ENCOUNTER — Ambulatory Visit: Admitting: Obstetrics and Gynecology

## 2023-08-25 ENCOUNTER — Other Ambulatory Visit (HOSPITAL_COMMUNITY)
Admission: RE | Admit: 2023-08-25 | Discharge: 2023-08-25 | Disposition: A | Discharge status: Home / Self Care | Source: Ambulatory Visit | Attending: Obstetrics and Gynecology | Admitting: Obstetrics and Gynecology

## 2023-08-25 ENCOUNTER — Encounter: Payer: Self-pay | Admitting: Obstetrics and Gynecology

## 2023-08-25 VITALS — BP 104/72 | HR 81 | Ht 63.0 in | Wt 192.0 lb

## 2023-08-25 DIAGNOSIS — Z113 Encounter for screening for infections with a predominantly sexual mode of transmission: Secondary | ICD-10-CM | POA: Insufficient documentation

## 2023-08-25 DIAGNOSIS — F32A Depression, unspecified: Secondary | ICD-10-CM

## 2023-08-25 DIAGNOSIS — N3946 Mixed incontinence: Secondary | ICD-10-CM

## 2023-08-25 DIAGNOSIS — N939 Abnormal uterine and vaginal bleeding, unspecified: Secondary | ICD-10-CM | POA: Diagnosis present

## 2023-08-25 MED ORDER — SERTRALINE HCL 25 MG PO TABS: 25.0000 mg | ORAL_TABLET | Freq: Every day | ORAL | 3 refills | Status: AC

## 2023-08-25 NOTE — Progress Notes (Signed)
 To ER 07/21/2023 for abnormal bleeding. Clotting at that time. Still breast feeding 7 1/2 mos. Now the clotting has lightened, whenever cleans perineal area, still bleeding dark red. Has right hip constant especially when walking. U/S done 07/21/2023. Abstaining from intercourse.

## 2023-08-25 NOTE — Progress Notes (Signed)
 GYNECOLOGY OFFICE VISIT NOTE  History:  Marissa Cross is a 33 y.o. [id number] here today for ER follow up on 7/7. Patient had her first period since delivery around mid June, which was normal. 2 weeks later she started bleeding again which lead her to the ER. Reports bleeding was heavy, lots of cramping & passing clots. U/s at ER was normal. She states this heavy bleeding continued up until a couple days ago. Reports she is currently spotting now. She has not been on birth control since delivery. She states around the end of June early July she started incorporating foods into her baby's diet, was previously exclusively breastfeeding. She denies any other life changes. Patient reports hip pain started around early July and has continued since then. Patient also reports continued struggle with depressed mood and some anxiety. She has seen [name] for [facility] in the past.   She denies any abnormal vaginal discharge. 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      Seizure [facility])     Past Surgical History:  Procedure Laterality Date   CESAREAN SECTION N/A 10/15/2016   Procedure: CESAREAN SECTION;  Surgeon: Barbette Knock, MD;  Location: [facility] BIRTHING SUITES;  Service: Obstetrics;  Laterality: N/A;    The following portions of the patient's history were reviewed and updated as appropriate: allergies, current medications, past family history, past medical history, past social history, past surgical history and problem list.   Health Maintenance:   ASCUS - HPV 2/25. Repeat pap in 3 years. Diagnosis  Date Value Ref Range Status  02/24/2023 (A)  Final   - Atypical squamous cells of undetermined  significance (ASC-US )     Review of Systems:  Pertinent items noted in HPI and remainder of comprehensive ROS otherwise negative.  Physical Exam:  BP 104/72   Pulse 81   Ht 5' 3 (1.6 m)   Wt 87.1 kg   LMP  (LMP Unknown)   BMI 34.01 kg/m  CONSTITUTIONAL: Well-developed, well-nourished female in no acute distress.  HEENT:  Normocephalic, atraumatic. External right and left ear normal. No scleral icterus.  NECK: Normal range of motion, supple, no masses noted on observation SKIN: No rash noted. Not diaphoretic. No erythema. No pallor. MUSCULOSKELETAL: Normal range of motion. No edema noted. NEUROLOGIC: Alert and oriented to person, place, and time. Normal muscle tone coordination. No cranial nerve deficit noted. PSYCHIATRIC: Normal mood and affect. Normal behavior. Normal judgment and thought content. CARDIOVASCULAR: Normal heart rate noted RESPIRATORY: Effort and breath sounds normal, no problems with respiration noted ABDOMEN: No masses noted. No other overt distention noted.   PELVIC: Normal appearing external genitalia; normal urethral meatus; normal appearing vaginal mucosa and cervix.  Scant, dark red bleeding noted in vaginal vault. Performed in the presence of a chaperone  Assessment and Plan:  1. Screen for STD (sexually transmitted disease) (Primary) - Cervicovaginal ancillary only  2. Abnormal uterine bleeding (AUB) -Pelvic exam normal. Swab today to rule out infections as cause of bleeding. Bleeding has slowed down to spotting for 3 days now. Discussed will continue to monitor as bleeding could've been an isolated event related to hormonal changes  with breastfeeding/postpartum. Will follow up in 1 month but advised to reach out sooner if bleeding increases again.  - Cervicovaginal ancillary only  3. Depression, unspecified depression type Patient is interested in started medication & following up with [facility] since it has been a while. Will start with low dose & see back in 1  month for follow up.  - sertraline  (ZOLOFT ) 25 MG tablet; Take 1 tablet (25 mg total) by mouth daily.  Dispense: 30 tablet; Refill: 3  Meds ordered this encounter  Medications   sertraline  (ZOLOFT ) 25 MG tablet    Sig: Take 1 tablet (25 mg total) by mouth daily.    Dispense:  30 tablet    Refill:  3   4. Mixed incontinence -reports present since delivery & would like referral. Discussed pelvic PT could be helpful for her hip pain as well. - Ambulatory referral to Physical Therapy  Routine preventative health maintenance measures emphasized. Please refer to After Visit Summary for other counseling recommendations.   Return in about 1 month (around 09/25/2023), or mood and bleeding follow up.  Elenor Mole, SWHNP 08/25/23

## 2023-08-27 LAB — CERVICOVAGINAL ANCILLARY ONLY
Bacterial Vaginitis (gardnerella): NEGATIVE
Candida Glabrata: NEGATIVE
Candida Vaginitis: NEGATIVE
Chlamydia: NEGATIVE
Comment: NEGATIVE
Comment: NEGATIVE
Comment: NEGATIVE
Comment: NEGATIVE
Comment: NEGATIVE
Comment: NORMAL
Neisseria Gonorrhea: NEGATIVE
Trichomonas: NEGATIVE

## 2023-08-28 ENCOUNTER — Ambulatory Visit: Payer: Self-pay | Admitting: Obstetrics and Gynecology

## 2023-09-05 NOTE — BH Specialist Note (Signed)
 Integrated Behavioral Health via Telemedicine Visit  09/10/2023 Marissa Cross [id number]  Number of Integrated Behavioral Health Clinician visits: 1- Initial Visit  Session Start time: 1023   Session End time: 1051  Total time in minutes: 28   Referring Provider: Glenys Birk, MD Patient/Family location: Home [facility] Provider location: [facility]  All persons participating in visit: Patient Marissa Cross and Marissa Cross   Types of Service: Individual psychotherapy and Video visit  I connected with Lurae Rasmus and/or Brayli Folds's n/a via  Telephone or Video Enabled Telemedicine Application  (Video is Caregility application) and verified that I am speaking with the correct person using two identifiers. Discussed confidentiality: Yes   I discussed the limitations of telemedicine and the availability of in person appointments.  Discussed there is a possibility of technology failure and discussed alternative modes of communication if that failure occurs.  I discussed that engaging in this telemedicine visit, they consent to the provision of behavioral healthcare and the services will be billed under their insurance.  Patient and/or legal guardian expressed understanding and consented to Telemedicine visit: Yes   Presenting Concerns: Patient and/or family reports the following symptoms/concerns: Adjusting to current life stressors; motivated for change with interest in improved fitness and nutrition for optimal health and wellness.  Duration of problem: Postpartum; Severity of problem: mild  Patient and/or Family's Strengths/Protective Factors: Social connections, Concrete supports in place (healthy food, safe environments, etc.), and Sense of purpose  Goals Addressed: Patient will:  Reduce symptoms of: anxiety   Increase knowledge and/or ability of: healthy habits   Demonstrate ability to: Increase healthy adjustment to  current life circumstances and Increase motivation to adhere to plan of care  Progress towards Goals: Ongoing    Interventions: Interventions utilized:  Motivational Interviewing and Supportive Reflection Standardized Assessments completed: Not Needed    Patient and/or Family Response: Patient agrees with treatment plan.   Clinical Assessment/Diagnosis  Generalized anxiety disorder .   Patient may benefit from psychoeducation and brief therapeutic interventions regarding coping with symptoms of anxiety   Plan: Follow up with behavioral health clinician on : Two weeks Behavioral recommendations:  -Continue using self-coping strategies that have remained helpful (mindfulness, relaxation breathing, focusing on things within personal realm of control) -Research local gyms to find the ones with childcare availability; pick at least one to visit -Continue renewed focus on limiting caffeine  (reduce slowly to minimize side effects such as headache) -Set up routine appointment with PCP; request referral to nutritionist -Continue prioritizing healthy self-care (regular meals, adequate rest; allowing practical help from supportive friends and family)  -Consider new mom support group as needed at either [email or web address].net or [email or web address]   Referral(s): Integrated [employer] (In Clinic)  I discussed the assessment and treatment plan with the patient and/or parent/guardian. They were provided an opportunity to ask questions and all were answered. They agreed with the plan and demonstrated an understanding of the instructions.   They were advised to call back or seek an in-person evaluation if the symptoms worsen or if the condition fails to improve as anticipated.  Warren JAYSON Mering, LCSW     12/20/2022    8:56 AM 08/20/2022    3:48 PM  Depression screen PHQ 2/9  Decreased Interest 0 1  Down, Depressed, Hopeless 0 1  PHQ - 2 Score 0 2  Altered sleeping 0 0   Tired, decreased energy 1 1  Change in appetite 0 0  Feeling  bad or failure about yourself  0 0  Trouble concentrating 1 1  Moving slowly or fidgety/restless 0 1  Suicidal thoughts 0   PHQ-9 Score 2 5      12/20/2022    8:58 AM 08/20/2022    3:49 PM  GAD 7 : Generalized Anxiety Score  Nervous, Anxious, on Edge 1 3  Control/stop worrying 0 2  Worry too much - different things 1 2  Trouble relaxing 0 1  Restless 0 1  Easily annoyed or irritable 1 1  Afraid - awful might happen 0 1  Total GAD 7 Score 3 11       02/24/2023   11:01 AM 01/09/2023   10:15 AM 01/09/2023    4:51 AM 01/08/2023    3:28 PM 10/18/2016   11:47 AM  Edinburgh Postnatal Depression Scale Screening Tool  I have been able to laugh and see the funny side of things. 0 0 -- -- 2  I have looked forward with enjoyment to things. 0 0   1  I have blamed myself unnecessarily when things went wrong. 0 1   1  I have been anxious or worried for no good reason. 0 2   1  I have felt scared or panicky for no good reason. 0 2   1  Things have been getting on top of me. 0 1   2  I have been so unhappy that I have had difficulty sleeping. 0 0   1  I have felt sad or miserable. 0 0   1  I have been so unhappy that I have been crying. 0 1   1  The thought of harming myself has occurred to me. 0 0   0  Edinburgh Postnatal Depression Scale Total 0  7    11      Data saved with a previous flowsheet row definition

## 2023-09-10 ENCOUNTER — Ambulatory Visit: Admitting: Clinical

## 2023-09-10 DIAGNOSIS — F411 Generalized anxiety disorder: Secondary | ICD-10-CM

## 2023-09-16 ENCOUNTER — Ambulatory Visit: Admitting: Obstetrics & Gynecology

## 2023-09-25 ENCOUNTER — Ambulatory Visit (INDEPENDENT_AMBULATORY_CARE_PROVIDER_SITE_OTHER): Admitting: Clinical

## 2023-09-25 DIAGNOSIS — F411 Generalized anxiety disorder: Secondary | ICD-10-CM

## 2023-09-25 DIAGNOSIS — F419 Anxiety disorder, unspecified: Secondary | ICD-10-CM

## 2023-09-25 NOTE — BH Specialist Note (Unsigned)
 Integrated Behavioral Health via Telemedicine Visit  09/26/2023 Marissa Cross [id number]  Number of Integrated Behavioral Health Clinician visits: 2- Second Visit  Session Start time: 9144   Session End time: 0917  Total time in minutes: 22  Referring Provider: Glenys Birk, MD Patient/Family location: Home [facility] Provider location: [facility]  All persons participating in visit: Patient Marissa Cross and [facility] Marissa Cross   Types of Service: Individual psychotherapy and Telephone visit  I connected with Marissa Cross and/or Marissa Cross's n/a via  Telephone or Video Enabled Telemedicine Application  (Video is Caregility application) and verified that I am speaking with the correct person using two identifiers. Discussed confidentiality: Yes   I discussed the limitations of telemedicine and the availability of in person appointments.  Discussed there is a possibility of technology failure and discussed alternative modes of communication if that failure occurs.  I discussed that engaging in this telemedicine visit, they consent to the provision of behavioral healthcare and the services will be billed under their insurance.  Patient and/or legal guardian expressed understanding and consented to Telemedicine visit: Yes   Presenting Concerns: Patient and/or family reports the following symptoms/concerns: Life stress/busyness/adjusting to back-to-school and other life changes.  Duration of problem: Postpartum; Severity of problem: mild  Patient and/or Family's Strengths/Protective Factors: Social connections, Concrete supports in place (healthy food, safe environments, etc.), and Sense of purpose  Goals Addressed: Patient will:  Reduce symptoms of: anxiety and stress   Increase knowledge and/or ability of: healthy habits and stress reduction   Demonstrate ability to: Increase healthy adjustment to current life circumstances and Increase  motivation to adhere to plan of care  Progress towards Goals: Ongoing    Interventions: Interventions utilized:  Motivational Interviewing and Supportive Reflection Standardized Assessments completed: Not Needed  Patient and/or Family Response: Patient agrees with treatment plan.  Clinical Assessment/Diagnosis  Generalized anxiety disorder   Patient may benefit from continued therapeutic intervention  Plan: Follow up with behavioral health clinician on : One month Behavioral recommendations:  -Renew focus on  daily self-coping strategies and healthy self-care as needed (mindfulness, relaxation breathing, focus on things within personal realm of control, healthy meals, adequate sleep, mindful of caffeine , time with supportive people in life) -Continue plan to find local gym with childcare availability -Continue to consider new mom support group at [email or web address] - Referral(s): Integrated [employer] (In Clinic) and [employer]:  new mom support  I discussed the assessment and treatment plan with the patient and/or parent/guardian. They were provided an opportunity to ask questions and all were answered. They agreed with the plan and demonstrated an understanding of the instructions.   They were advised to call back or seek an in-person evaluation if the symptoms worsen or if the condition fails to improve as anticipated.  Warren JAYSON Mering, LCSW     12/20/2022    8:56 AM 08/20/2022    3:48 PM  Depression screen PHQ 2/9  Decreased Interest 0 1  Down, Depressed, Hopeless 0 1  PHQ - 2 Score 0 2  Altered sleeping 0 0  Tired, decreased energy 1 1  Change in appetite 0 0  Feeling bad or failure about yourself  0 0  Trouble concentrating 1 1  Moving slowly or fidgety/restless 0 1  Suicidal thoughts 0   PHQ-9 Score 2 5      12/20/2022    8:58 AM 08/20/2022    3:49 PM  GAD 7 :  Generalized Anxiety Score  Nervous, Anxious, on Edge 1 3  Control/stop  worrying 0 2  Worry too much - different things 1 2  Trouble relaxing 0 1  Restless 0 1  Easily annoyed or irritable 1 1  Afraid - awful might happen 0 1  Total GAD 7 Score 3 11

## 2023-09-26 ENCOUNTER — Ambulatory Visit: Admitting: Obstetrics and Gynecology

## 2023-10-19 IMAGING — MR MR HEAD WO/W CM
12 series · 48 of 48 positions shown · IV contrast (multihance)
Comparison: Head CT 02/21/2017

CLINICAL DATA: Long history of dizzy spells and headaches. Numbness
and weakness in the hands.

EXAM:
MRI HEAD WITHOUT AND WITH CONTRAST
TECHNIQUE: Multiplanar, multiecho pulse sequences of the brain and surrounding
structures were obtained without and with intravenous contrast.
CONTRAST:  17mL MULTIHANCE GADOBENATE DIMEGLUMINE 529 MG/ML IV SOLN

[Series 2: T1 · sagittal · 5.0mm · 0.45mm/px · 2 of 21 slices shown]
[im 1/21]
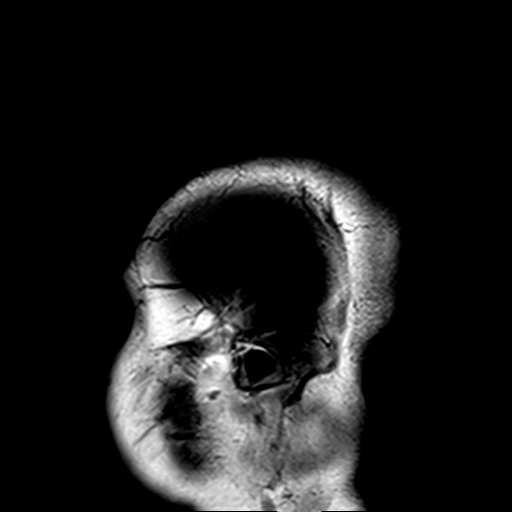
[im 21/21]
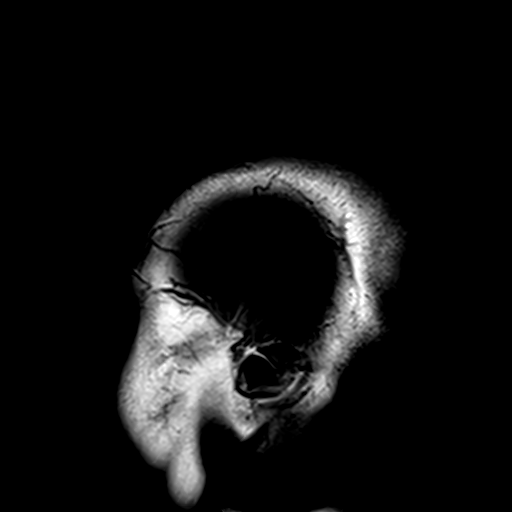

[Series 3: DWI · axial · 3.0mm · 1.80mm/px · z∈[-71,+69]mm · 7 of 96 slices shown (1 of 4)]
[im 1/96]
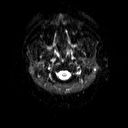
[im 16/96]
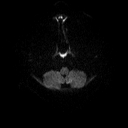
[im 32/96]
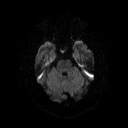
[im 48/96]
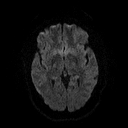
[im 64/96]
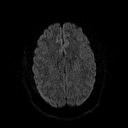
[im 80/96]
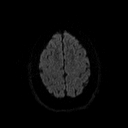
[im 96/96]
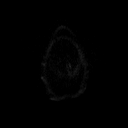

[Series 4: DWI · axial · 3.0mm · 1.80mm/px · z∈[-71,+69]mm · 3 of 48 slices shown (2 of 4)]
[im 1/48]
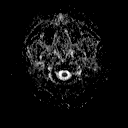
[im 24/48]
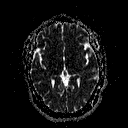
[im 48/48]
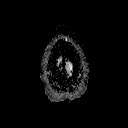

[Series 5: DWI · coronal · 5.0mm · 1.80mm/px · 5 of 72 slices shown (3 of 4)]
[im 1/72]
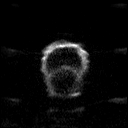
[im 18/72]
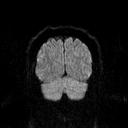
[im 36/72]
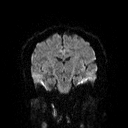
[im 54/72]
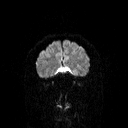
[im 72/72]
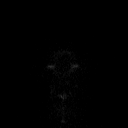

[Series 6: DWI · coronal · 5.0mm · 1.80mm/px · 2 of 36 slices shown (4 of 4)]
[im 1/36]
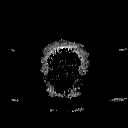
[im 36/36]
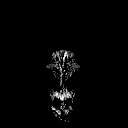

[Series 7: T2 · axial · 5.0mm · 0.72mm/px · 1 of 22 slices shown (1 of 2)]
[im 1/22]
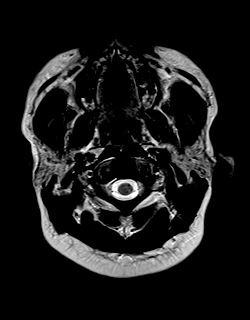

[Series 8: FLAIR · axial · 3.0mm · 0.45mm/px · z∈[-73,+71]mm · 2 of 32 slices shown]
[im 1/32]
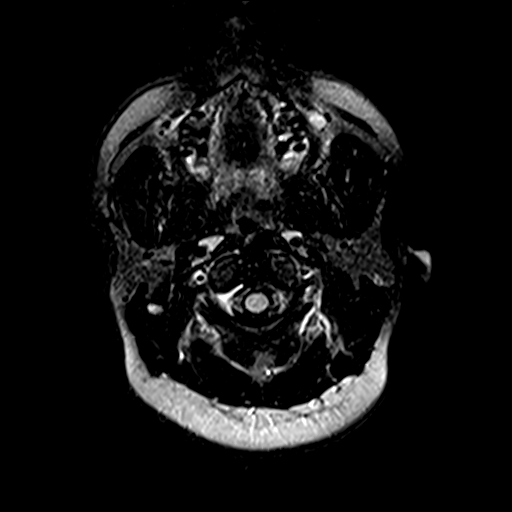
[im 32/32]
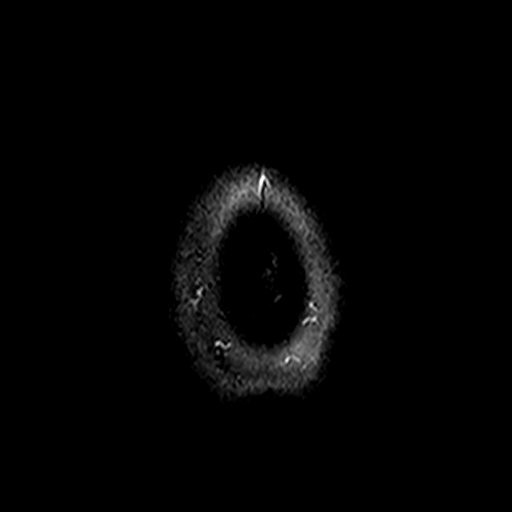

[Series 10: swi_images · axial · 4.0mm · 0.90mm/px · z∈[-71,+69]mm · 2 of 36 slices shown]
[im 1/36]
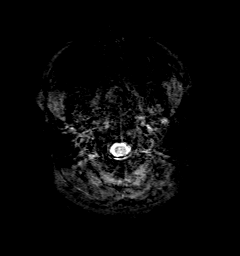
[im 36/36]
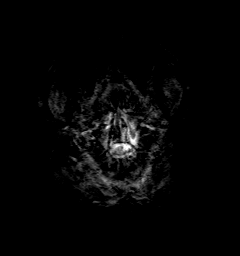

[Series 11: t1_mpr_tra · axial · 1.0mm · 0.75mm/px · z∈[-72,+71]mm · 10 of 144 slices shown]
[im 1/144]
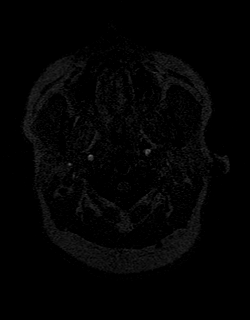
[im 16/144]
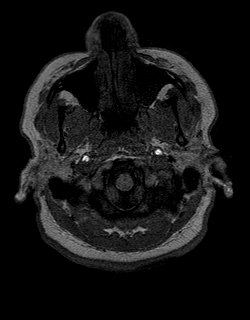
[im 32/144]
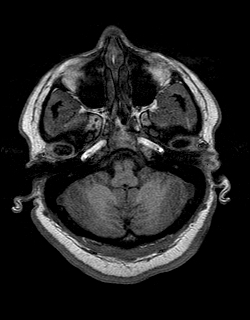
[im 48/144]
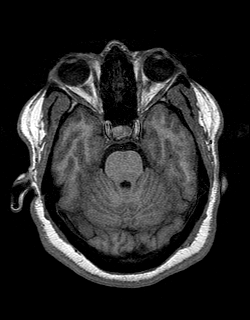
[im 64/144]
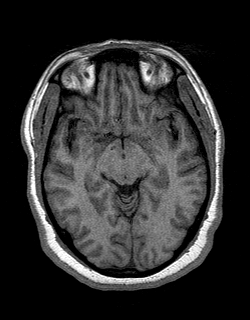
[im 80/144]
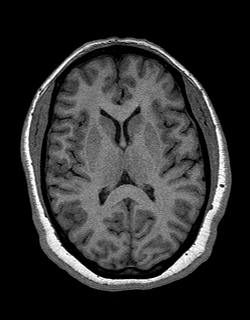
[im 96/144]
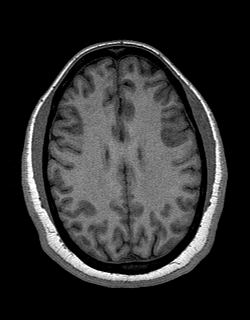
[im 112/144]
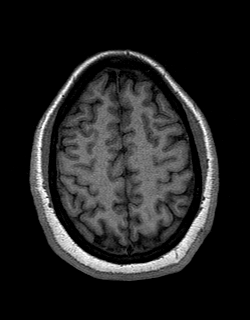
[im 128/144]
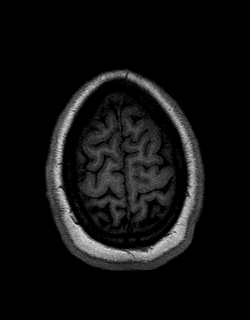
[im 144/144]
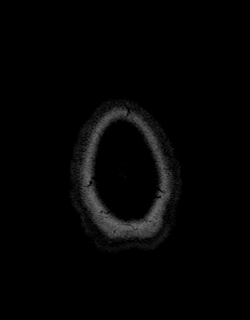

[Series 12: T2 · coronal · 5.0mm · 0.45mm/px · 2 of 27 slices shown (2 of 2)]
[im 1/27]
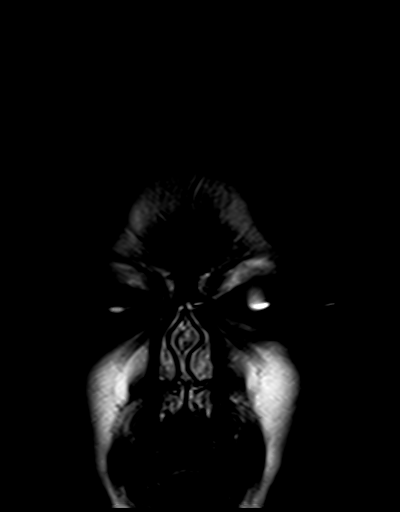
[im 27/27]
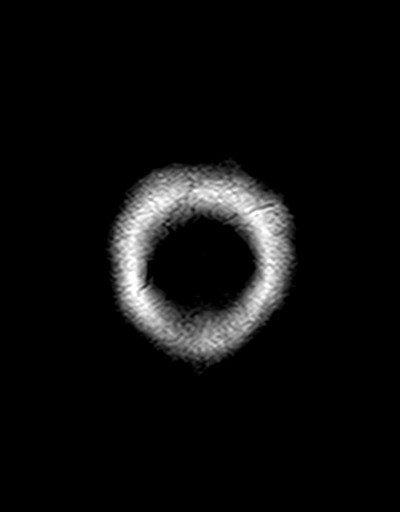

[Series 13: t1_mpr_tra post · axial · 1.0mm · 0.75mm/px · z∈[-72,+71]mm · 10 of 144 slices shown]
[im 1/144]
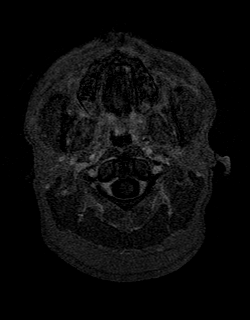
[im 16/144]
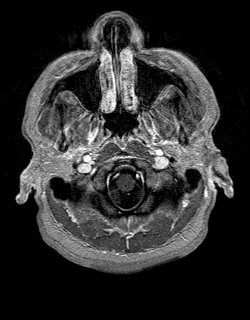
[im 32/144]
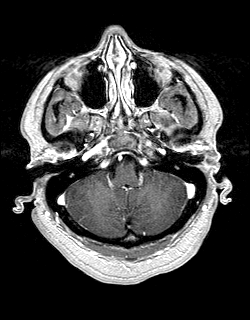
[im 48/144]
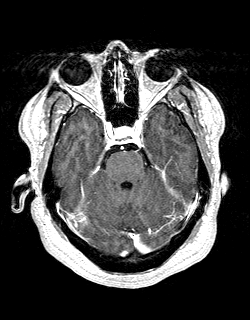
[im 64/144]
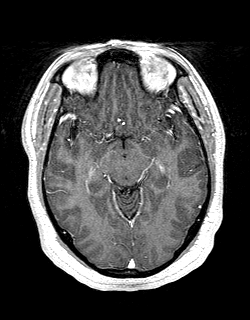
[im 80/144]
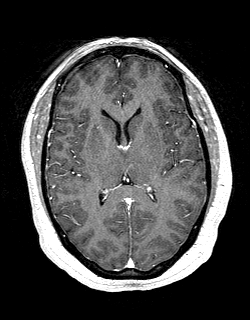
[im 96/144]
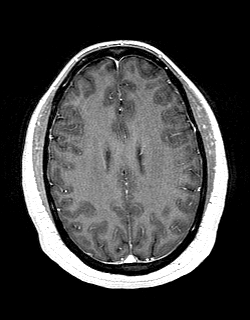
[im 112/144]
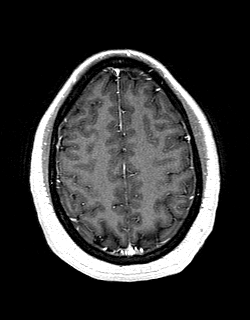
[im 128/144]
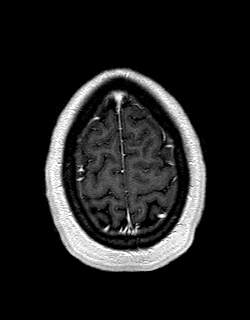
[im 144/144]
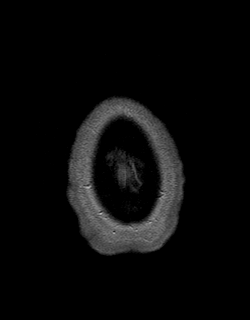

[Series 14: post cor · coronal · 5.0mm · 0.45mm/px · 2 of 27 slices shown]
[im 1/27]
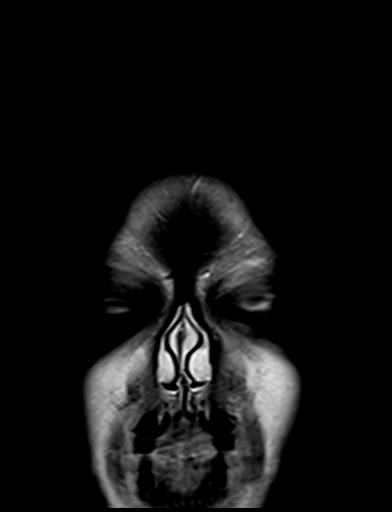
[im 27/27]
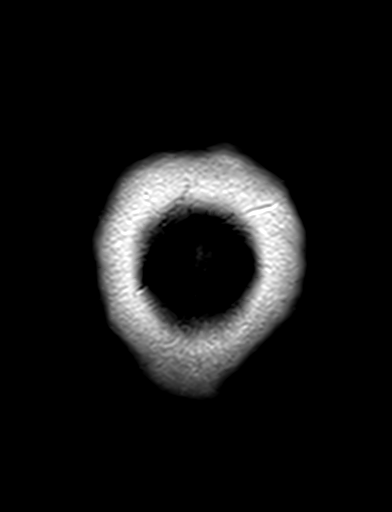

[48 of 48 positions shown; findings below may reference images not displayed]

FINDINGS: Brain: No infarction, hemorrhage, hydrocephalus, extra-axial
collection or mass lesion. No white matter disease or atrophy

Vascular: Normal flow voids and vascular enhancements

Skull and upper cervical spine: Normal marrow signal

Sinuses/Orbits: Negative
IMPRESSION: Normal brain MRI.

## 2023-10-20 ENCOUNTER — Ambulatory Visit

## 2023-10-23 ENCOUNTER — Ambulatory Visit

## 2023-10-23 NOTE — BH Specialist Note (Unsigned)
 error

## 2023-11-07 NOTE — BH Specialist Note (Unsigned)
 Integrated Behavioral Health via Telemedicine Visit  11/12/2023 ELLIONA DODDRIDGE [id number]  Number of Integrated Behavioral Health Clinician visits: 3- Third Visit  Session Start time: 1316   Session End time: 1356  Total time in minutes: 40  Referring Provider: Glenys Birk, MD Patient/Family location: Home [facility] Provider location: [facility]  All persons participating in visit: Patient Krimson Massmann and [facility] Aneira Cavitt   Types of Service: Individual psychotherapy and Video visit  I connected with Madlynn Kilbourne and/or Yeira Ferrari's n/a via  Telephone or Video Enabled Telemedicine Application  (Video is Caregility application) and verified that I am speaking with the correct person using two identifiers. Discussed confidentiality: Yes   I discussed the limitations of telemedicine and the availability of in person appointments.  Discussed there is a possibility of technology failure and discussed alternative modes of communication if that failure occurs.  I discussed that engaging in this telemedicine visit, they consent to the provision of behavioral healthcare and the services will be billed under their insurance.  Patient and/or legal guardian expressed understanding and consented to Telemedicine visit: Yes   Presenting Concerns: Patient and/or family reports the following symptoms/concerns: Daily fatigue and anxiety regarding ongoing life stress  Duration of problem: Ongoing; Severity of problem: mild  Patient and/or Family's Strengths/Protective Factors: Social connections, Concrete supports in place (healthy food, safe environments, etc.), Sense of purpose, and Physical Health (exercise, healthy diet, medication compliance, etc.)  Goals Addressed: Patient will:  Reduce symptoms of: anxiety and stress   Increase knowledge and/or ability of: stress reduction   Demonstrate ability to: Increase healthy adjustment to current  life circumstances and Increase motivation to adhere to plan of care  Progress towards Goals: Ongoing  Interventions: Interventions utilized:  Motivational Interviewing and Supportive Reflection Standardized Assessments completed: Not Needed  Patient and/or Family Response: Patient agrees with treatment plan.   Clinical Assessment/Diagnosis  Generalized anxiety disorder   Patient may benefit from continued therapeutic intervention  .  Plan: Follow up with behavioral health clinician on : three weeks Behavioral recommendations:  -Continue daily self-coping strategies and healthy self-care (mindfulness, focus on things within realm of control, healthy sleeping/eating, being mindful of caffeine , time with supportive people in life; shared childcare with mom and sister) -Continue to consider taking time away alone prior to holiday chaos (at least one day) -Continue to consider [employer] classes; continue using video workouts as able Referral(s): Integrated [employer] (In Clinic)  I discussed the assessment and treatment plan with the patient and/or parent/guardian. They were provided an opportunity to ask questions and all were answered. They agreed with the plan and demonstrated an understanding of the instructions.   They were advised to call back or seek an in-person evaluation if the symptoms worsen or if the condition fails to improve as anticipated.  Warren JAYSON Mering, LCSW     12/20/2022    8:56 AM 08/20/2022    3:48 PM  Depression screen PHQ 2/9  Decreased Interest 0 1  Down, Depressed, Hopeless 0 1  PHQ - 2 Score 0 2  Altered sleeping 0 0  Tired, decreased energy 1 1  Change in appetite 0 0  Feeling bad or failure about yourself  0 0  Trouble concentrating 1 1  Moving slowly or fidgety/restless 0 1  Suicidal thoughts 0   PHQ-9 Score 2 5      12/20/2022    8:58 AM 08/20/2022    3:49 PM  GAD 7 : Generalized Anxiety Score  Nervous, Anxious, on Edge 1 3   Control/stop worrying 0 2  Worry too much - different things 1 2  Trouble relaxing 0 1  Restless 0 1  Easily annoyed or irritable 1 1  Afraid - awful might happen 0 1  Total GAD 7 Score 3 11

## 2023-11-10 ENCOUNTER — Ambulatory Visit: Admitting: Clinical

## 2023-11-10 DIAGNOSIS — F411 Generalized anxiety disorder: Secondary | ICD-10-CM | POA: Diagnosis not present

## 2023-11-25 ENCOUNTER — Encounter: Payer: Self-pay | Admitting: Neurology

## 2023-11-28 NOTE — BH Specialist Note (Signed)
 Integrated Behavioral Health via Telemedicine Visit  12/01/2023 INARI SHIN [id number]  Number of Integrated Behavioral Health Clinician visits: 4- Fourth Visit  Session Start time: [phone]   Session End time: 0920  Total time in minutes: 37  Referring Provider: Glenys Birk, MD Patient/Family location: Home [facility] Provider location: [facility]  All persons participating in visit: Patient Marissa Cross and [facility] Shalae Belmonte   Types of Service: Individual psychotherapy and Video visit  I connected with Marissa Cross and/or Marissa Cross's n/a via  Telephone or Video Enabled Telemedicine Application  (Video is Caregility application) and verified that I am speaking with the correct person using two identifiers. Discussed confidentiality: Yes   I discussed the limitations of telemedicine and the availability of in person appointments.  Discussed there is a possibility of technology failure and discussed alternative modes of communication if that failure occurs.  I discussed that engaging in this telemedicine visit, they consent to the provision of behavioral healthcare and the services will be billed under their insurance.  Patient and/or legal guardian expressed understanding and consented to Telemedicine visit: Yes   Presenting Concerns: Patient and/or family reports the following symptoms/concerns: brain fog, inability to focus, fatigue, stress eating, lack of concentration, anxiety, worry, restless, irritability, dread; drinks coffee to help with focus; able to fall asleep easier after drinking coffee. Pt has family history of female sibling with ADHD; has not been evaluated, but has suspected with symptoms since at least early childhood; open to initial screening. Pt's immediate goal is to start new holiday traditions this year with her children that will bring peace and calm, rather than chaos and feeling overwhelmed as in the past with  large family-of-origin.  Duration of problem: Ongoing; Severity of problem: mild  Patient and/or Family's Strengths/Protective Factors: Social connections, Concrete supports in place (healthy food, safe environments, etc.), Sense of purpose, and Physical Health (exercise, healthy diet, medication compliance, etc.)  Goals Addressed: Patient will:  Reduce symptoms of: anxiety and stress   Increase knowledge and/or ability of: stress reduction   Demonstrate ability to: Increase healthy adjustment to current life circumstances, Increase adequate support systems for patient/family, and Increase motivation to adhere to plan of care  Progress towards Goals: Ongoing  Interventions: Interventions utilized:  Motivational Interviewing, Psychoeducation and/or Health Education, and Link to [employer] Standardized Assessments completed: ASRS, GAD-7, and PHQ 9  Patient and/or Family Response: Patient agrees with treatment plan.  Clinical Assessment/Diagnosis  Generalized anxiety disorder   Patient may benefit from further ADHD/ADD evaluation and continued therapeutic intervention.  Plan: Follow up with behavioral health clinician on : One month Behavioral recommendations:  -Continue plan for new holiday traditions this year, as discussed -Continue daily self-coping strategies that have remained helpful (mindfulness, focus on personal realm of control; mindful sleeping and eating; time with supportive people in life; shared childcare with mom and sister) -Consider drinking a cup of coffee every night at bedtime; notice if this helps to fall asleep and stay asleep longer or not -Continue to consider [employer] classes and/or taking a self-care day without children for at least one day before holiday time begins -Consider [facility] for ADHD assessment, for greater self-awareness and clarity Referral(s): Integrated Behavioral Health Services (In Clinic) and Psychological  Evaluation/Testing  I discussed the assessment and treatment plan with the patient and/or parent/guardian. They were provided an opportunity to ask questions and all were answered. They agreed with the plan and demonstrated an understanding  of the instructions.   They were advised to call back or seek an in-person evaluation if the symptoms worsen or if the condition fails to improve as anticipated.  Warren JAYSON Mering, LCSW     12/01/2023    8:44 AM 12/20/2022    8:56 AM 08/20/2022    3:48 PM  Depression screen PHQ 2/9  Decreased Interest 1 0 1  Down, Depressed, Hopeless 1 0 1  PHQ - 2 Score 2 0 2  Altered sleeping 2 0 0  Tired, decreased energy 3 1 1   Change in appetite 3 0 0  Feeling bad or failure about yourself  0 0 0  Trouble concentrating 3 1 1   Moving slowly or fidgety/restless 1 0 1  Suicidal thoughts 0 0   PHQ-9 Score 14 2  5       Data saved with a previous flowsheet row definition      12/01/2023    8:58 AM 12/20/2022    8:58 AM 08/20/2022    3:49 PM  GAD 7 : Generalized Anxiety Score  Nervous, Anxious, on Edge 3 1 3   Control/stop worrying 1 0 2  Worry too much - different things 3 1 2   Trouble relaxing 3 0 1  Restless 3 0 1  Easily annoyed or irritable 3 1 1   Afraid - awful might happen 3 0 1  Total GAD 7 Score 19 3 11

## 2023-12-01 ENCOUNTER — Ambulatory Visit: Admitting: Clinical

## 2023-12-01 DIAGNOSIS — F411 Generalized anxiety disorder: Secondary | ICD-10-CM

## 2023-12-01 NOTE — Patient Instructions (Signed)
[facility] for [facility] [address] [place], [place] [address] [phone] ([place] office) [phone] [facility] office)  [facility] [address] [place], [place]  [address]  ADHD testing Learning disorder testing Psychological evaluations  [email or web address] [phone]

## 2023-12-05 NOTE — Therapy (Deleted)
 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      Seizure [facility])    Past Surgical History:  Procedure Laterality Date   CESAREAN SECTION N/A 10/15/2016   Procedure: CESAREAN SECTION;  Surgeon: Barbette Knock, MD;  Location: [facility] BIR[facility]ING SUITES;  Service: Obstetrics;  Laterality: N/A;   There are no active problems to display for this patient.   PCP: Bulah Alm RAMAN, PA-C  REFERRING PROVIDER: Delores Nidia CROME, FNP   REFERRING DIAG: [phone] (ICD-10-CM) - Mixed incontinence   THERAPY DIAG:  No diagnosis found.  Rationale for Evaluation and Treatment: {HABREHAB:27488}  ONSET DATE: ***  SUBJECTIVE:                                                                                                                                                                                           SUBJECTIVE STATEMENT: Mixed incontinence since delivery 8 months ago. Also having continued hip pain ; vaginal delivery on 01/08/23 Fluid intake:   FUNCTIONAL LIMITATIONS: ***  PERTINENT HISTORY:  Medications for current condition: *** Surgeries: Cesarean section 10/15/16 Other: Seizure Sexual abuse: {Yes/No:304960894}  PAIN:  Are you having pain? {yes/no:20286} NPRS scale: ***/10 Pain location: {pelvic pain location:27098}  Pain type: {type:313116} Pain description: {PAIN DESCRIPTION:21022940}   Aggravating factors: *** Relieving factors: ***  PRECAUTIONS: None  RED FLAGS: {PT Red Flags:29287}   WEIGHT  BEARING RESTRICTIONS: No  FALLS:  Has patient fallen in last 6 months? {fallsyesno:27318}  OCCUPATION: ***  ACTIVITY LEVEL : ***  PLOF: {PLOF:24004}  PATIENT GOALS: ***   BOWEL MOVEMENT: Pain with bowel movement: {yes/no:20286} Type of bowel movement:{PT BM type:27100} Fully empty rectum: {No/Yes:304960894} Leakage: {Yes/No:304960894}                                                  Caused by: *** Bowel urgency: *** Pads: {Yes/No:304960894} Fiber supplement/laxative {YES/NO AS:20300}  URINATION: Pain with urination: {yes/no:20286} Fully empty bladder: {Yes/No:304960894}***  Post-void dribble: {YES/NO AS:20300} Stream: {PT urination:27102} Urgency: {YES/NO AS:20300} Frequency:during the day ***                                                        Nocturia: {Yes/No:304960894}***   Leakage: {PT leakage:27103} Pads/briefs: {Yes/No:304960894}  INTERCOURSE:  Ability to have vaginal penetration {YES/NO:21197} Pain with intercourse: {pain with intercourse PA:27099} Dryness: {YES/NO AS:20300} Climax: *** Marinoff Scale: ***/3 Lubricant:  PREGNANCY: Vaginal deliveries *** Tearing {Yes***/No:304960894} Episiotomy {YES/NO AS:20300} C-section deliveries *** Currently pregnant {Yes***/No:304960894}  PROLAPSE: {PT prolapse:27101}   OBJECTIVE:  Note: Objective measures were completed at Evaluation unless otherwise noted.  DIAGNOSTIC FINDINGS:  Post-void residual: Voiding Cystourethrogram (VCUG):  Ultrasound: ***  PATIENT SURVEYS:  {rehab surveys:24030}  PFIQ-7: *** UIQ-7 *** CRAIG -7 *** POPIQ-7 *** Female Sexual Function Index (FSFI) Questionnaire ***  COGNITION: Overall cognitive status: {cognition:24006}     SENSATION: Light touch: {intact/deficits:24005}  LUMBAR SPECIAL TESTS:  {lumbar special test:25242}  FUNCTIONAL TESTS:  {Functional tests:24029} Single leg stance:  Rt:  Lt: Sit-up test: Squat: Bed  mobility:  GAIT: Assistive device utilized: {Assistive devices:23999} Comments: ***  POSTURE: {posture:25561}   LUMBARAROM/PROM:  A/PROM A/PROM  Eval (% available)  Flexion   Extension   Right lateral flexion   Left lateral flexion   Right rotation   Left rotation    (Blank rows = not tested)  LOWER EXTREMITY ROM:  {AROM/PROM:27142} ROM Right eval Left eval  Hip flexion    Hip extension    Hip abduction    Hip adduction    Hip internal rotation    Hip external rotation    Knee flexion    Knee extension    Ankle dorsiflexion    Ankle plantarflexion    Ankle inversion    Ankle eversion     (Blank rows = not tested)  LOWER EXTREMITY MMT:  MMT Right eval Left eval  Hip flexion    Hip extension    Hip abduction    Hip adduction    Hip internal rotation    Hip external rotation    Knee flexion    Knee extension    Ankle dorsiflexion    Ankle plantarflexion    Ankle inversion    Ankle eversion     (Blank rows = not tested) PALPATION:  General: ***  Pelvic Alignment: ***  Abdominal: ***  Diastasis: {Yes/No:304960894}*** Distortion: {YES/NO AS:20300}  Breathing: *** Scar tissue: {Yes/No:304960894}*** Active Straight Leg Raise: ***                External Perineal Exam: ***                             Internal Pelvic Floor: ***  Patient confirms identification and approves PT to assess internal pelvic floor and treatment {yes/no:20286} All internal or external pelvic floor assessments and/or treatments are completed with proper hand hygiene and gloves hands. If needed gloves are changed with hand hygiene during patient care time.  PELVIC MMT:   MMT eval  Vaginal   Internal Anal Sphincter   External Anal Sphincter   Puborectalis   (Blank rows = not tested)        TONE: ***  PROLAPSE: ***  TODAY'S TREATMENT:  DATE: ***  EVAL ***   PATIENT EDUCATION:  Education details: *** Person educated: {Person educated:25204} Education method: {Education Method:25205} Education comprehension: {Education Comprehension:25206}  HOME EXERCISE PROGRAM: ***  ASSESSMENT:  CLINICAL IMPRESSION: Patient is a *** y.o. *** who was seen today for physical therapy evaluation and treatment for ***.   OBJECTIVE IMPAIRMENTS: {opptimpairments:25111}.   ACTIVITY LIMITATIONS: {activitylimitations:27494}  PARTICIPATION LIMITATIONS: {participationrestrictions:25113}  PERSONAL FACTORS: {Personal factors:25162} are also affecting patient's functional outcome.   REHAB POTENTIAL: {rehabpotential:25112}  CLINICAL DECISION MAKING: {clinical decision making:25114}  EVALUATION COMPLEXITY: {Evaluation complexity:25115}   GOALS: Goals reviewed with patient? {yes/no:20286}  SHORT TERM GOALS: Target date: ***  *** Baseline: Goal status: INITIAL  2.  *** Baseline:  Goal status: INITIAL  3.  *** Baseline:  Goal status: INITIAL  4.  *** Baseline:  Goal status: INITIAL  5.  *** Baseline:  Goal status: INITIAL  6.  *** Baseline:  Goal status: INITIAL  LONG TERM GOALS: Target date: ***  *** Baseline:  Goal status: INITIAL  2.  *** Baseline:  Goal status: INITIAL  3.  *** Baseline:  Goal status: INITIAL  4.  *** Baseline:  Goal status: INITIAL  5.  *** Baseline:  Goal status: INITIAL  6.  *** Baseline:  Goal status: INITIAL  PLAN:  PT FREQUENCY: {rehab frequency:25116}  PT DURATION: {rehab duration:25117}  PLANNED INTERVENTIONS: {rehab planned interventions:25118::97110-Therapeutic exercises,97530- Therapeutic [phone]- Neuromuscular re-education,97535- Self Rjmz,02859- Manual therapy,Patient/Family education}  PLAN FOR NEXT SESSION: ***   Harpreet Pompey, PT 12/05/2023, 9:21 AM

## 2023-12-08 ENCOUNTER — Ambulatory Visit: Admitting: Physical Therapy

## 2023-12-10 ENCOUNTER — Ambulatory Visit: Admitting: Physical Therapy

## 2023-12-16 NOTE — BH Specialist Note (Unsigned)
 Integrated Behavioral Health via Telemedicine Visit  12/16/2023 [name] [id number]  Number of Integrated Behavioral Health Clinician visits: 4- Fourth Visit  Session Start time: [phone]   Session End time: 0920  Total time in minutes: 37  Referring Provider: Glenys Birk, MD Patient/Family location: [facility] Provider location: Center for [facility] at [facility]  All persons participating in visit: Patient Marissa Cross and [facility] Lyanna Blystone   Types of Service: Individual psychotherapy and Video visit  I connected with Stefanee Tiano and/or Giulietta Tigges's n/a via  Telephone or Video Enabled Telemedicine Application  (Video is Caregility application) and verified that I am speaking with the correct person using two identifiers. Discussed confidentiality: No   I discussed the limitations of telemedicine and the availability of in person appointments.  Discussed there is a possibility of technology failure and discussed alternative modes of communication if that failure occurs.  I discussed that engaging in this telemedicine visit, they consent to the provision of behavioral healthcare and the services will be billed under their insurance.  Patient and/or legal guardian expressed understanding and consented to Telemedicine visit: No   Presenting Concerns: Patient and/or family reports the following symptoms/concerns: Self-control issues and wanting to set a healthy example for children regarding food; tried not eating after 7pm, but difficult as it's only quiet time to herself at night with hectic evening schedule. Pt open to brainstorming ideas.  Duration of problem: Ongoing; Severity of problem: moderate  Patient and/or Family's Strengths/Protective Factors: Social connections, Concrete supports in place (healthy food, safe environments, etc.), Sense of purpose, and Physical Health (exercise, healthy diet, medication compliance, etc.)  Goals  Addressed: Patient will:  Reduce symptoms of: anxiety and stress   Increase knowledge and/or ability of: healthy habits and stress reduction   Demonstrate ability to: Increase healthy adjustment to current life circumstances and Increase motivation to adhere to plan of care  Progress towards Goals: Ongoing  Interventions: Interventions utilized:  Motivational Interviewing and Solution-Focused Strategies Standardized Assessments completed: Not Needed  Patient and/or Family Response: Patient agrees with treatment plan.  Clinical Assessment/Diagnosis  No diagnosis found.  Patient may benefit from continued therapeutic intervention.  Plan: Follow up with behavioral health clinician on : One month Behavioral recommendations:  -Decide upon at least one easy meal to prepare at home next week (example: meatballs in the crock pot, as discussed) -Consider setting a later time to stop eating for the day, and gradually reduce the time by 30 minutes(ex. First week midnight, second week 11:30pm; by 10th week, 7:30pm); after the agreed-upon time, replace any food with hot herbal tea or water -Continue self-coping strategies (mindfulness, focus on personal realm of control, mindful sleeping/eating, time with supportive people in life; shared childcare with mom and sister) -Continue to consider [employer] classes and [facility] as able in the future Referral(s): Integrated [employer] (In Clinic)  I discussed the assessment and treatment plan with the patient and/or parent/guardian. They were provided an opportunity to ask questions and all were answered. They agreed with the plan and demonstrated an understanding of the instructions.   They were advised to call back or seek an in-person evaluation if the symptoms worsen or if the condition fails to improve as anticipated.  Warren JAYSON Mering, LCSW     12/01/2023    8:44 AM 12/20/2022    8:56 AM 08/20/2022    3:48 PM   Depression screen PHQ 2/9  Decreased Interest 1 0 1  Down, Depressed, Hopeless 1 0 1  PHQ - 2 Score 2 0 2  Altered sleeping 2 0 0  Tired, decreased energy 3 1 1   Change in appetite 3 0 0  Feeling bad or failure about yourself  0 0 0  Trouble concentrating 3 1 1   Moving slowly or fidgety/restless 1 0 1  Suicidal thoughts 0 0   PHQ-9 Score 14 2  5       Data saved with a previous flowsheet row definition      12/01/2023    8:58 AM 12/20/2022    8:58 AM 08/20/2022    3:49 PM  GAD 7 : Generalized Anxiety Score  Nervous, Anxious, on Edge 3 1 3   Control/stop worrying 1 0 2  Worry too much - different things 3 1 2   Trouble relaxing 3 0 1  Restless 3 0 1  Easily annoyed or irritable 3 1 1   Afraid - awful might happen 3 0 1  Total GAD 7 Score 19 3 11

## 2023-12-18 ENCOUNTER — Ambulatory Visit: Admitting: Clinical

## 2023-12-18 DIAGNOSIS — F411 Generalized anxiety disorder: Secondary | ICD-10-CM

## 2023-12-18 NOTE — Patient Instructions (Signed)
 Center for [facility] Healthcare at [facility] [address] [place], [place] [address] [phone] (main office) [phone] (Adalina Dopson's office)

## 2024-01-01 ENCOUNTER — Ambulatory Visit: Admitting: Obstetrics and Gynecology

## 2024-01-01 ENCOUNTER — Other Ambulatory Visit: Payer: Self-pay | Admitting: Family Medicine

## 2024-01-01 ENCOUNTER — Encounter: Payer: Self-pay | Admitting: Obstetrics and Gynecology

## 2024-01-01 ENCOUNTER — Other Ambulatory Visit: Payer: Self-pay

## 2024-01-01 VITALS — BP 113/77 | HR 80 | Wt 192.3 lb

## 2024-01-01 DIAGNOSIS — N941 Unspecified dyspareunia: Secondary | ICD-10-CM | POA: Diagnosis not present

## 2024-01-01 DIAGNOSIS — Z8619 Personal history of other infectious and parasitic diseases: Secondary | ICD-10-CM

## 2024-01-01 DIAGNOSIS — N946 Dysmenorrhea, unspecified: Secondary | ICD-10-CM

## 2024-01-01 DIAGNOSIS — N939 Abnormal uterine and vaginal bleeding, unspecified: Secondary | ICD-10-CM

## 2024-01-01 DIAGNOSIS — O0990 Supervision of high risk pregnancy, unspecified, unspecified trimester: Secondary | ICD-10-CM

## 2024-01-01 DIAGNOSIS — Z8669 Personal history of other diseases of the nervous system and sense organs: Secondary | ICD-10-CM | POA: Diagnosis not present

## 2024-01-01 MED ORDER — VALACYCLOVIR HCL 1 G PO TABS: ORAL_TABLET | ORAL | 0 refills | Status: AC

## 2024-01-01 MED ORDER — DROSPIRENONE-ETHINYL ESTRADIOL 3-0.02 MG PO TABS: 1.0000 | ORAL_TABLET | Freq: Every day | ORAL | 11 refills | Status: AC

## 2024-01-01 NOTE — Progress Notes (Signed)
 GYNECOLOGY VISIT  Patient name: Marissa Cross MRN [id number]  Date of birth: 15-Apr-1990 Chief Complaint:   Gynecologic Exam  History:  Marissa Cross has been having AUB since delivery of her last baby. At it's heaviest, changes her pad 4-5x in a day. Passing clots that are normal to her, having pain with te bleeding includign in her back, butt, and sides. Wants to start OCPs - something to help control the bleeding and acne. Report having more headaches, dizziness but not able to see neurologist until next year. Uncomfortable during intercourse, very sporadic, baby is nearly 77 year old at this time. Sometimes the perids are really heavy and concerned about early menopause, continues to get lightheaded and gets tired.  Sweating more than anticipated but not quite hot flashes. Used to have dizzy spells that had resolved with anti-convulsants, and now have returned. At that time had HA and seizures. Currently unemployed but feels like when doing any activity will have some symptoms. Still nursing currently but would like to stop, baby not ready. Had gone down on anti-convulsant during pregnancy. Wondering if there is anything else that can be contributing. Headaches got better but would take some time with the new doses, but didn't achieve 100% improvement  Used depo, and nexplaonon - had period that lasted 10 montsh with the first child o ndepo and with nexplanon  did not stop bleeding, now has gone a year wtihotu birth control and would like to start on something  Currently nursing and feels that skin is going crazy Menses are monthly, will be proceeded by bad cramps having hair growth on the face as well  Did not take a lot to get pregnant, was surprise but not difficulty  When started bleeding in July had not had a menses in while and it was heavy     The following portions of the patient's history were reviewed and updated as appropriate: allergies, current medications, past family history,  past medical history, past social history, past surgical history and problem list.   Health Maintenance:   Last pap     Component Value Date/Time   DIAGPAP (A) 02/24/2023 1115    - Atypical squamous cells of undetermined significance (ASC-US )   DIAGPAP  02/02/2019 1443    - Negative for intraepithelial lesion or malignancy (NILM)   HPVHIGH Negative 02/24/2023 1115   ADEQPAP  02/24/2023 1115    Satisfactory for evaluation; transformation zone component PRESENT.   ADEQPAP  02/02/2019 1443    Satisfactory for evaluation; transformation zone component PRESENT.    Health Maintenance  Topic Date Due   DTaP/Tdap/Td vaccine (1 - Tdap) Never done   Pneumococcal Vaccine (1 of 2 - PCV) Never done   Hepatitis B Vaccine (1 of 3 - 19+ 3-dose series) Never done   HPV Vaccine (1 - 3-dose SCDM series) Never done   Flu Shot  08/15/2023   COVID-19 Vaccine (3 - 2025-26 season) 09/15/2023   Pap with HPV screening  02/24/2028   Hepatitis C Screening  Completed   HIV Screening  Completed   Meningitis B Vaccine  Aged Out    Review of Systems:  Pertinent items are noted in HPI. Comprehensive review of systems was otherwise negative.   Objective:  Physical Exam BP 113/77   Pulse 80   Wt 192 lb 4.8 oz (87.2 kg)   BMI 34.06 kg/m    Physical Exam Vitals and nursing note reviewed.  Constitutional:      Appearance: Normal appearance.  HENT:     Head: Normocephalic and atraumatic.  Pulmonary:     Effort: Pulmonary effort is normal.  Skin:    General: Skin is warm and dry.  Neurological:     General: No focal deficit present.     Mental Status: She is alert.  Psychiatric:        Mood and Affect: Mood normal.        Behavior: Behavior normal.        Thought Content: Thought content normal.        Judgment: Judgment normal.    Labs and Imaging TVUS 07/21/23 IMPRESSION: 1. No ultrasound abnormality of the pelvis to explain pain. 2. Trace free fluid in the low pelvis, likely  physiologic. 3. Normal arterial and venous Doppler flow to the bilateral ovaries.     Assessment & Plan:   1. Abnormal uterine bleeding (AUB) (Primary) 2. Dysmenorrhea 3. Dyspareunia in female Patient has abnormal uterine bleeding.  Will order abnormal uterine bleeding evaluation labs and pelvic ultrasound to evaluate for any structural gynecologic abnormalities.  Prior US  demonstrated thick EMS during Day 2/3 of heavy vaginal bleeding. If stripe remains thickened, recommend sampling. Yaz sent to pharmacy for initial management of symptoms. No CHC contraindications though notes a hx of migraines without aura.   - CBC - Hemoglobin A1c - Testosterone ,Free and Total - TSH Rfx on Abnormal to Free T4 - US  PELVIC COMPLETE WITH TRANSVAGINAL; Future - Follicle stimulating hormone  4. Hx of seizure disorder Additional labs in context of anti-convulsant use and recommend keeping follow up with neurology, particularly given that OCP use can lower effectiveness of anti-convulsant therapy.  - B12 and Folate Panel  5. H/O cold sores Refills sent for prn outbreaks, recommend follow up with derm as well  - valACYclovir  (VALTREX ) 1000 MG tablet; 2 tablets BID x 1 day for flare up cold sore  Dispense: 20 tablet; Refill: 0    Carter Quarry, MD Minimally Invasive Gynecologic Surgery [facility], [facility]

## 2024-01-02 ENCOUNTER — Ambulatory Visit: Payer: Self-pay | Admitting: Obstetrics and Gynecology

## 2024-01-02 LAB — CBC
Hematocrit: 40.8 % (ref 34.0–46.6)
Hemoglobin: 13.9 g/dL (ref 11.1–15.9)
MCH: 30 pg (ref 26.6–33.0)
MCHC: 34.1 g/dL (ref 31.5–35.7)
MCV: 88 fL (ref 79–97)
Platelets: 240 x10E3/uL (ref 150–450)
RBC: 4.64 x10E6/uL (ref 3.77–5.28)
RDW: 13.4 % (ref 11.7–15.4)
WBC: 3.7 x10E3/uL (ref 3.4–10.8)

## 2024-01-02 LAB — B12 AND FOLATE PANEL
Folate: >20 ng/mL
Vitamin B-12: 862 pg/mL (ref 232–1245)

## 2024-01-02 LAB — HEMOGLOBIN A1C
Est. average glucose Bld gHb Est-mCnc: 108 mg/dL
Hgb A1c MFr Bld: 5.4 % (ref 4.8–5.6)

## 2024-01-02 LAB — TSH RFX ON ABNORMAL TO FREE T4: TSH: 1.37 u[IU]/mL (ref 0.450–4.500)

## 2024-01-02 LAB — TESTOSTERONE,FREE AND TOTAL
Testosterone, Free: 1.4 pg/mL (ref 0.0–4.2)
Testosterone: 17 ng/dL (ref 8–60)

## 2024-01-02 LAB — FOLLICLE STIMULATING HORMONE: FSH: 1.5 m[IU]/mL

## 2024-01-13 ENCOUNTER — Other Ambulatory Visit: Payer: Self-pay

## 2024-01-13 DIAGNOSIS — O0990 Supervision of high risk pregnancy, unspecified, unspecified trimester: Secondary | ICD-10-CM

## 2024-01-13 MED ORDER — PRENATAL PLUS 27-1 MG PO TABS: 1.0000 | ORAL_TABLET | Freq: Every day | ORAL | 3 refills | Status: AC

## 2024-01-13 NOTE — Telephone Encounter (Signed)
 Refill request for prenatal vitamins received from [facility] pharmacy. Refill ordered.   Vernell RN  01/13/24

## 2024-01-16 NOTE — BH Specialist Note (Signed)
 Pt did not arrive to video visit and did not answer the phone; Left HIPPA-compliant message to call back Warren from [employer] for [employer] at [facility] for [facility] at  [phone] [facility] office).  ?; left MyChart message for patient.  ? ?

## 2024-01-21 ENCOUNTER — Telehealth: Payer: Self-pay | Admitting: Neurology

## 2024-01-21 ENCOUNTER — Encounter: Payer: Self-pay | Admitting: Neurology

## 2024-01-21 NOTE — Progress Notes (Signed)
 No show  This encounter was created in error - please disregard.

## 2024-01-22 ENCOUNTER — Ambulatory Visit: Admitting: Clinical

## 2024-01-22 ENCOUNTER — Ambulatory Visit (HOSPITAL_COMMUNITY)

## 2024-01-22 DIAGNOSIS — Z91199 Patient's noncompliance with other medical treatment and regimen due to unspecified reason: Secondary | ICD-10-CM

## 2024-02-02 ENCOUNTER — Ambulatory Visit: Admitting: Neurology

## 2024-03-16 ENCOUNTER — Ambulatory Visit: Payer: Self-pay | Admitting: Neurology
